# Patient Record
Sex: Female | Born: 1960 | Race: White | Hispanic: No | Marital: Single | Smoking: Never smoker
Health system: Southern US, Community
[De-identification: ages and names within clinical notes are randomized; demographics above are authoritative.]

## PROBLEM LIST (undated history)

## (undated) DIAGNOSIS — F329 Major depressive disorder, single episode, unspecified: Secondary | ICD-10-CM

## (undated) DIAGNOSIS — K5190 Ulcerative colitis, unspecified, without complications: Secondary | ICD-10-CM

## (undated) DIAGNOSIS — B977 Papillomavirus as the cause of diseases classified elsewhere: Secondary | ICD-10-CM

## (undated) DIAGNOSIS — M810 Age-related osteoporosis without current pathological fracture: Secondary | ICD-10-CM

## (undated) DIAGNOSIS — F419 Anxiety disorder, unspecified: Secondary | ICD-10-CM

## (undated) DIAGNOSIS — F32A Depression, unspecified: Secondary | ICD-10-CM

## (undated) DIAGNOSIS — K219 Gastro-esophageal reflux disease without esophagitis: Secondary | ICD-10-CM

## (undated) DIAGNOSIS — K7460 Unspecified cirrhosis of liver: Secondary | ICD-10-CM

## (undated) HISTORY — PX: CHOLECYSTECTOMY: SHX55

## (undated) HISTORY — PX: EYE SURGERY: SHX253

## (undated) HISTORY — PX: COLON SURGERY: SHX602

---

## 2005-07-09 ENCOUNTER — Emergency Department: Payer: Self-pay | Admitting: Emergency Medicine

## 2006-06-19 ENCOUNTER — Ambulatory Visit: Payer: Self-pay | Admitting: Unknown Physician Specialty

## 2007-10-04 IMAGING — US ABDOMEN ULTRASOUND
1 series · 17 of 25 positions shown · non-contrast
Comparison: none

REASON FOR EXAM: Abdominal pain  RUQ
COMMENTS:

[Series 1: abdomen ultrasound · 17 of 56 slices shown]
[im 1/56]
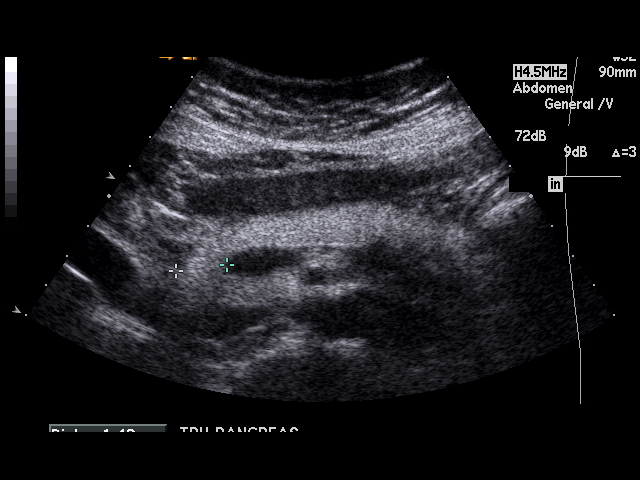
[im 5/56]
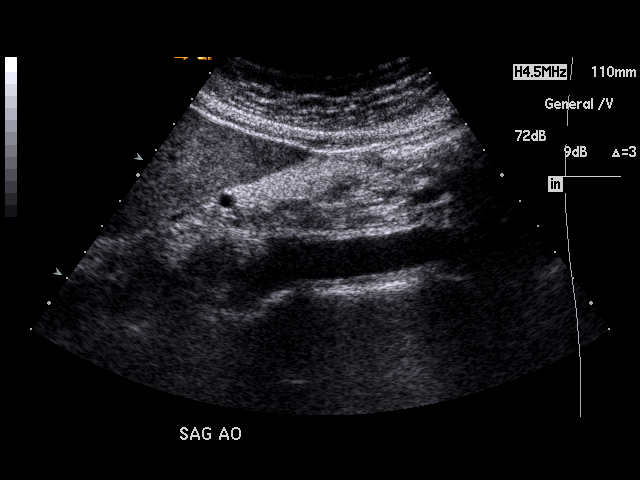
[im 7/56]
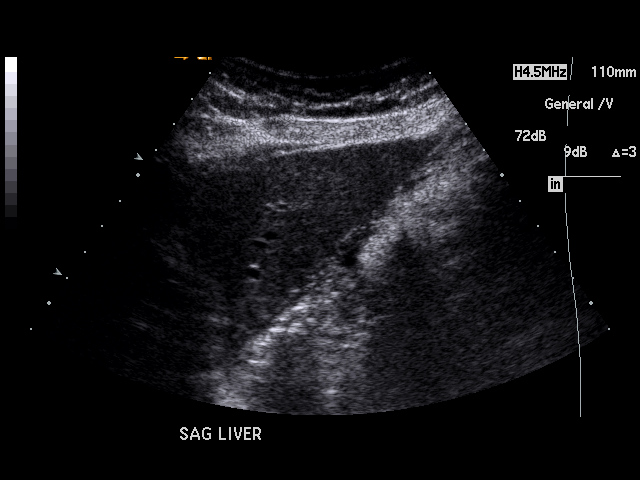
[im 12/56]
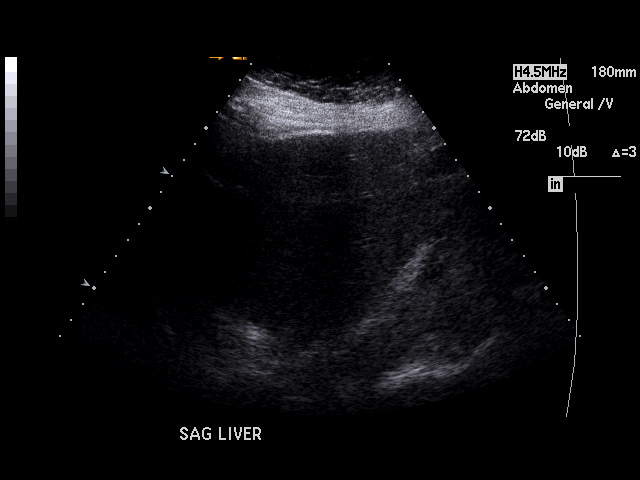
[im 14/56]
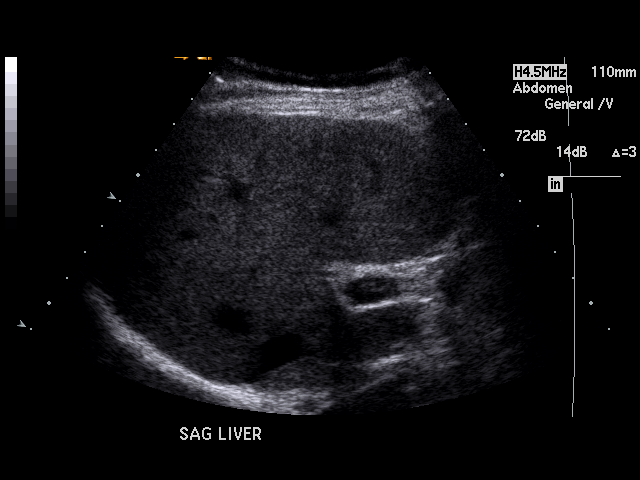
[im 19/56]
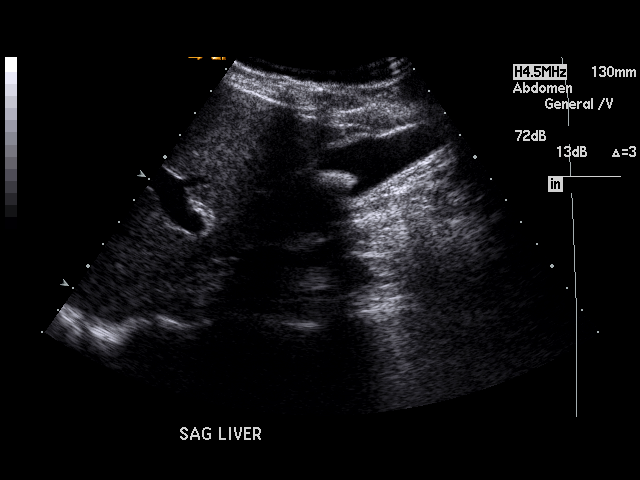
[im 21/56]
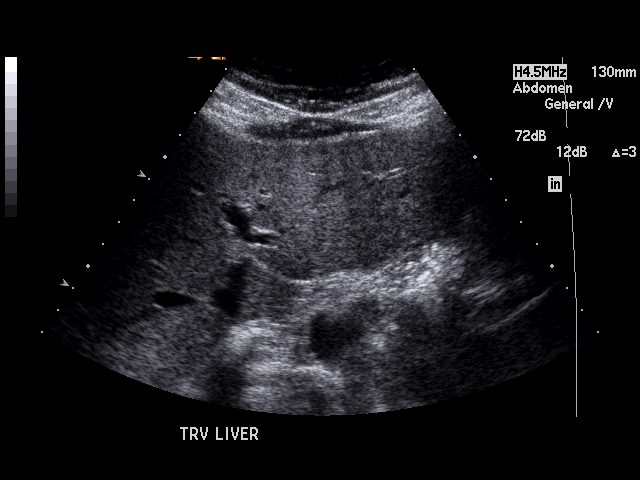
[im 26/56]
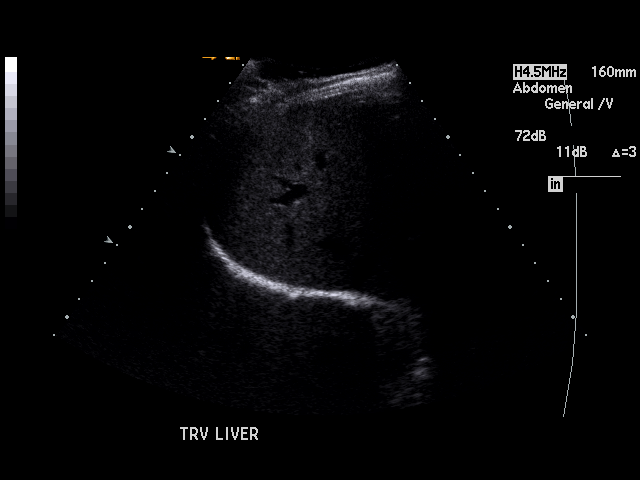
[im 28/56]
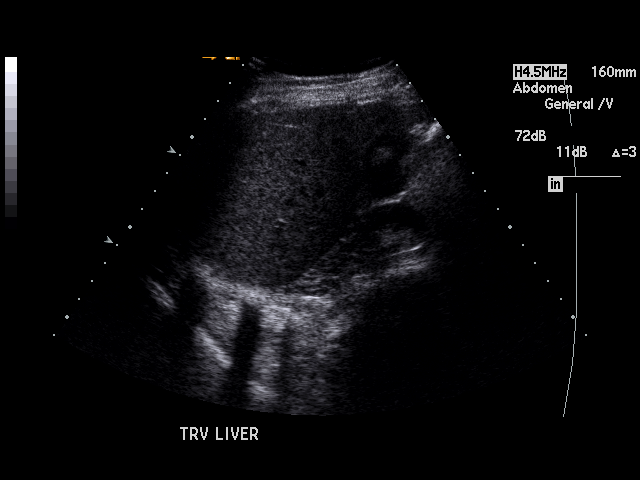
[im 30/56]
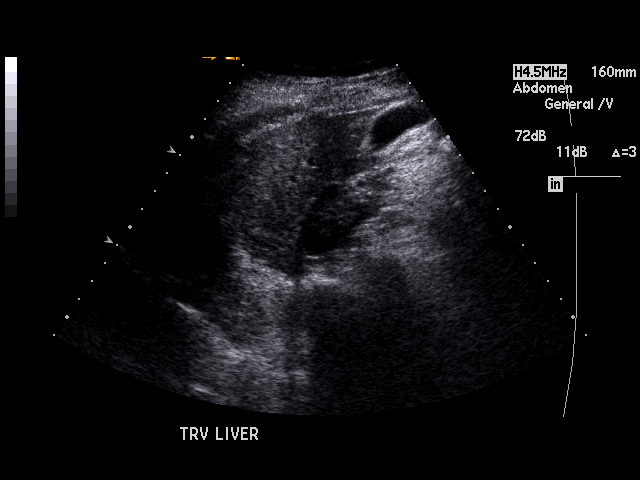
[im 35/56]
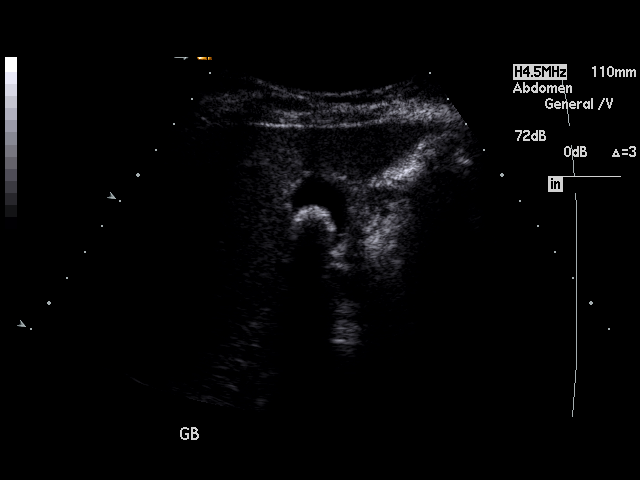
[im 37/56]
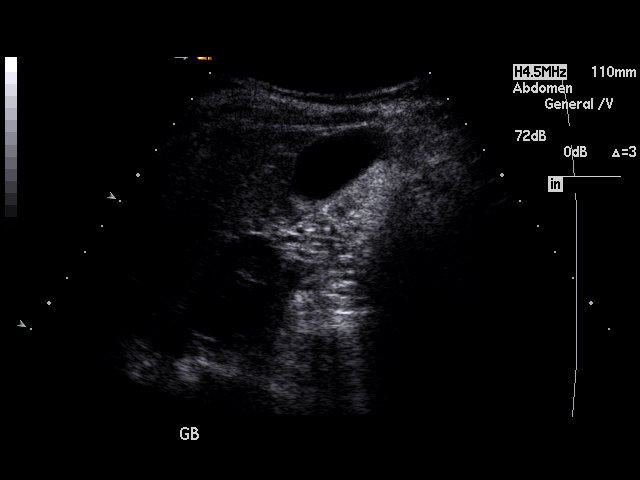
[im 42/56]
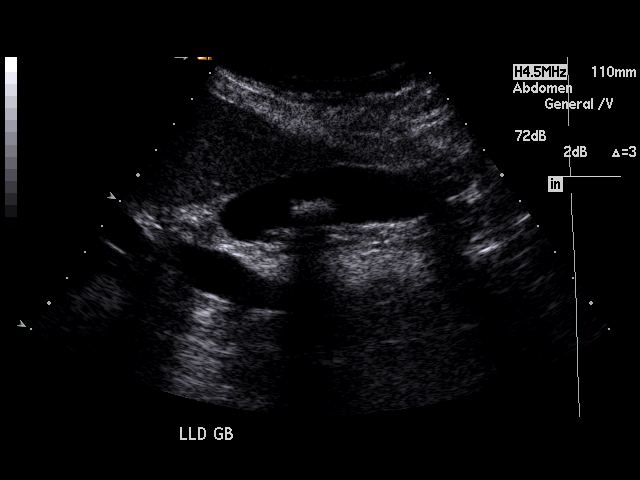
[im 44/56]
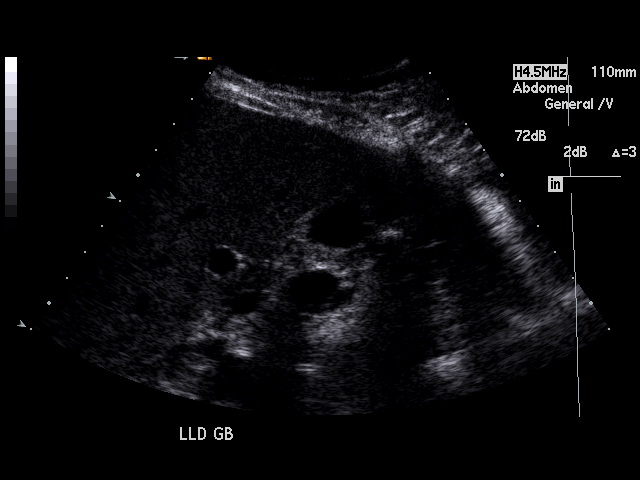
[im 49/56]
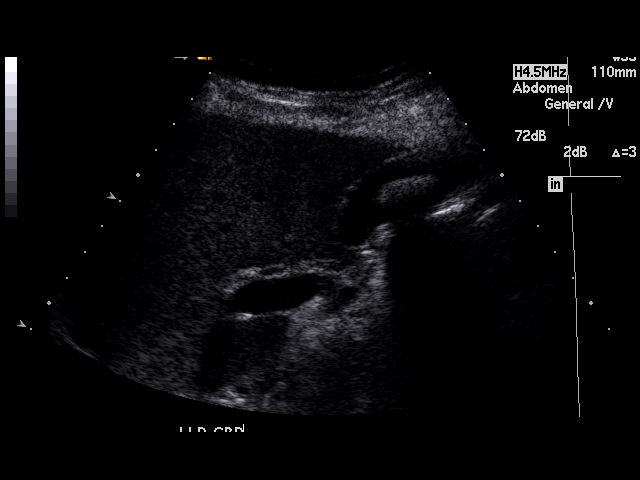
[im 51/56]
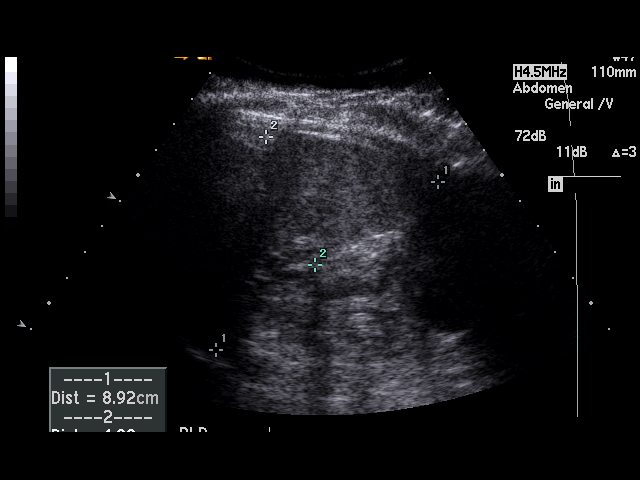
[im 56/56]
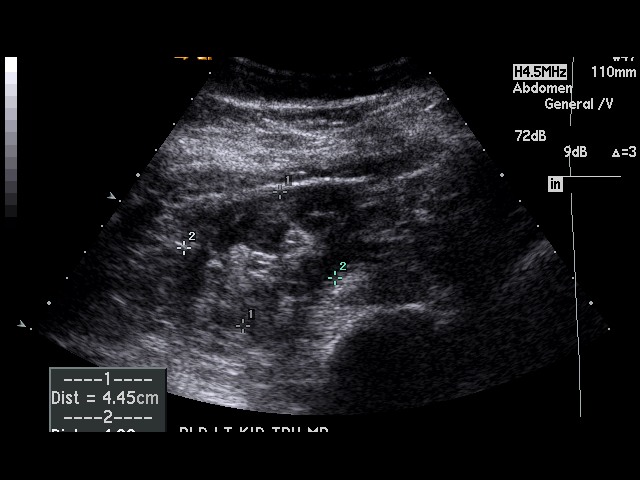

[17 of 25 positions shown; findings below may reference images not displayed]

PROCEDURE:     US  - US ABDOMEN GENERAL SURVEY  - June 19, 2006  [DATE]

RESULT:       The liver demonstrates a homogenous echotexture.  The common
bile duct measures 3 mm in diameter. There is no evidence of intra or
extrahepatic biliary ductal dilatation.

Evaluation of the gallbladder demonstrates no evidence of pericholecystic
fluid.  A mobile gallstone is demonstrated within the gallbladder.  This
appears to measure approximately 1.5 cm in diameter.  Gallbladder wall
thickness is 2.9 mm.  The patient did not demonstrate a sonographic Murphy
sign.   Hepatopetal flow is demonstrated within the portal vein.  The
pancreas, spleen and kidneys are unremarkable.  There is no evidence of
abdominal aortic aneurysm.
IMPRESSION: Mobile gallstone without further sonographic abnormalities.

## 2011-01-16 ENCOUNTER — Emergency Department: Payer: Self-pay | Admitting: Emergency Medicine

## 2012-08-06 ENCOUNTER — Emergency Department: Payer: Self-pay | Admitting: Emergency Medicine

## 2012-08-19 ENCOUNTER — Emergency Department: Payer: Self-pay | Admitting: Emergency Medicine

## 2012-12-13 ENCOUNTER — Emergency Department: Payer: Self-pay | Admitting: Emergency Medicine

## 2012-12-27 ENCOUNTER — Emergency Department: Payer: Self-pay | Admitting: Internal Medicine

## 2016-12-08 ENCOUNTER — Encounter: Payer: Self-pay | Admitting: *Deleted

## 2016-12-08 ENCOUNTER — Ambulatory Visit
Admission: EM | Admit: 2016-12-08 | Discharge: 2016-12-08 | Disposition: A | Discharge status: Home / Self Care | Payer: BC Managed Care – PPO

## 2016-12-08 DIAGNOSIS — S90211A Contusion of right great toe with damage to nail, initial encounter: Secondary | ICD-10-CM | POA: Diagnosis not present

## 2016-12-08 DIAGNOSIS — S99921S Unspecified injury of right foot, sequela: Secondary | ICD-10-CM

## 2016-12-08 HISTORY — DX: Major depressive disorder, single episode, unspecified: F32.9

## 2016-12-08 HISTORY — DX: Anxiety disorder, unspecified: F41.9

## 2016-12-08 HISTORY — DX: Gastro-esophageal reflux disease without esophagitis: K21.9

## 2016-12-08 HISTORY — DX: Ulcerative colitis, unspecified, without complications: K51.90

## 2016-12-08 HISTORY — DX: Unspecified cirrhosis of liver: K74.60

## 2016-12-08 HISTORY — DX: Papillomavirus as the cause of diseases classified elsewhere: B97.7

## 2016-12-08 HISTORY — DX: Depression, unspecified: F32.A

## 2016-12-08 HISTORY — DX: Age-related osteoporosis without current pathological fracture: M81.0

## 2016-12-08 NOTE — Discharge Instructions (Signed)
-  allow nail to fall off naturally -can secure nail to toe with tape or band aid if needed to allow for wearing shoes or socks without discomfort or further injury. -can continue to use OTC ointment if needed

## 2016-12-08 NOTE — ED Provider Notes (Signed)
CSN: [id number]     Arrival date & time 12/08/16  1120 History   First MD Initiated Contact with Patient 12/08/16 1144     Chief Complaint  Patient presents with  . Toe Injury   (Consider location/radiation/quality/duration/timing/severity/associated sxs/prior Treatment) Patient is a 56 year old female with past medical history as noted below who presents with complaint of right toenail injury approximately 2 weeks ago. Patient reports stopping her toenail on a railroad tie approximate 2 weeks ago. She states she has used some antibiotic ointment and this kept the toe taped. She denies any pain. Patient reports that most of her nail peeled up this morning but it is still attached at the base.      Past Medical History:  Diagnosis Date  . Anxiety   . Colitis, ulcerative (HCC)   . Depression (emotion)   . GERD (gastroesophageal reflux disease)   . HPV (human papilloma virus) infection   . Liver cirrhosis (HCC)   . Osteoporosis    Past Surgical History:  Procedure Laterality Date  . CHOLECYSTECTOMY    . COLON SURGERY    . EYE SURGERY     History reviewed. No pertinent family history. Social History  Substance Use Topics  . Smoking status: Never Smoker  . Smokeless tobacco: Never Used  . Alcohol use No   OB History    No data available     Review of Systems  As noted above in history of present illness. Patient does have a history of anxiety. Other systems reviewed and found to be negative.  Allergies  Oxycodone; Promethazine; and Sulfa antibiotics  Home Medications   Prior to Admission medications   Medication Sig Start Date End Date Taking? Authorizing Provider  acetaZOLAMIDE (DIAMOX) 250 MG tablet Take 250 mg by mouth 3 (three) times daily.   Yes [provider]  alendronate (FOSAMAX) 70 MG tablet Take 70 mg by mouth once a week. Take with a full glass of water on an empty stomach.   Yes [provider]  brimonidine (ALPHAGAN P) 0.1 % SOLN    Yes  [provider]  Fluocinonide Emulsified Base (LIDEX-E EX) Apply topically.   Yes [provider]  loteprednol (LOTEMAX) 0.2 % SUSP 1 drop 4 (four) times daily.   Yes [provider]  Multiple Vitamins-Minerals (MULTIVITAMIN WITH MINERALS) tablet Take 1 tablet by mouth daily.   Yes [provider]  omeprazole (PRILOSEC) 20 MG capsule Take 20 mg by mouth daily.   Yes [provider]  predniSONE (DELTASONE) 10 MG tablet Take 10 mg by mouth daily with breakfast.   Yes [provider]  triamcinolone cream (KENALOG) 0.1 % Apply 1 application topically 2 (two) times daily.   Yes [provider]   Meds Ordered and Administered this Visit  Medications - No data to display  BP 118/82 (BP Location: Left Arm)   Pulse 77   Temp 97.9 F (36.6 C)   Resp 16   Ht 5\' 2"  (1.575 m)   Wt 114 lb (51.7 kg)   SpO2 99%   BMI 20.85 kg/m  No data found.   Physical Exam  Constitutional: She is oriented to person, place, and time.  Eyes: Pupils are equal, round, and reactive to light. EOM are normal.  Neck: Normal range of motion.  Cardiovascular: Normal rate.   Pulmonary/Chest: Effort normal.  Musculoskeletal: Normal range of motion.       Feet:  Neurological: She is alert and oriented to person, place,  and time.  Skin: Skin is warm and dry.    Urgent Care Course     Procedures (including critical care time)  Labs Review Labs Reviewed - No data to display  Imaging Review No results found.   MDM   1. Injury of right great toe, sequela    Patient presents status post injury to her right great toe 2 weeks ago with complaint of her toenail not completely coming off. Patient states her nail started to separate today but still remains connected at the base. There are no signs of infection and nail is, as above, only attached at the base. Advised patient would be best to leave the nail and allow it to fall off naturally so as not to  provide a access point for infection. Patient advised that she take use Band-Aid or tape to hold the nail down so that it would not interfere with wearing socks or her shoes until the nail fell off. patient verbalized understanding is in agreement with the plan.  Candis SchatzMichael D Delonta Yohannes, PA-C    Candis SchatzHarris, Audi Wettstein D, PA-C 12/08/16 [phone]

## 2016-12-08 NOTE — ED Triage Notes (Signed)
Patient injured right big toe nail 2 weeks ago. The toenail is only attached at the base.

## 2017-08-02 ENCOUNTER — Ambulatory Visit
Admission: EM | Admit: 2017-08-02 | Discharge: 2017-08-02 | Disposition: A | Discharge status: Home / Self Care | Payer: BC Managed Care – PPO | Attending: Family Medicine | Admitting: Family Medicine

## 2017-08-02 DIAGNOSIS — M940 Chondrocostal junction syndrome [Tietze]: Secondary | ICD-10-CM

## 2017-08-02 DIAGNOSIS — J4521 Mild intermittent asthma with (acute) exacerbation: Secondary | ICD-10-CM

## 2017-08-02 DIAGNOSIS — J209 Acute bronchitis, unspecified: Secondary | ICD-10-CM | POA: Diagnosis not present

## 2017-08-02 MED ORDER — ALBUTEROL SULFATE HFA 108 (90 BASE) MCG/ACT IN AERS: 2.0000 | INHALATION_SPRAY | Freq: Four times a day (QID) | RESPIRATORY_TRACT | 0 refills | Status: DC | PRN

## 2017-08-02 MED ORDER — AZITHROMYCIN 250 MG PO TABS: 250.0000 mg | ORAL_TABLET | Freq: Every day | ORAL | 0 refills | Status: DC

## 2017-08-02 NOTE — ED Triage Notes (Signed)
Pt said she has been having chest congestion for 2 weeks. Said it's worse when she lays down and is unable to sleep along wheezing. Said it's a dry cough and runny nose. Has been taking mucinex without relief.

## 2017-08-02 NOTE — ED Provider Notes (Signed)
[facility]    CSN: [id number] Arrival date & time: 08/02/17  1934     History   Chief Complaint Chief Complaint  Patient presents with  . Nasal Congestion    HPI Joni Fearsamela Y Vohs is a 57 y.o. female.   The history is provided by the patient.  Cough  Cough characteristics:  Productive Sputum characteristics:  White Severity:  Mild Duration:  2 weeks Timing:  Intermittent Progression:  Waxing and waning Smoker: no   Context: exposure to allergens and weather changes   Context: not animal exposure, not fumes, not occupational exposure, not sick contacts, not smoke exposure, not upper respiratory infection and not with activity   Relieved by:  Nothing Worsened by:  Nothing Ineffective treatments: prednisone. Associated symptoms: shortness of breath and wheezing   Associated symptoms: no chills, no ear fullness, no ear pain, no eye discharge, no fever, no myalgias, no rash, no rhinorrhea, no sinus congestion and no sore throat     Past Medical History:  Diagnosis Date  . Anxiety   . Colitis, ulcerative (HCC)   . Depression (emotion)   . GERD (gastroesophageal reflux disease)   . HPV (human papilloma virus) infection   . Liver cirrhosis (HCC)   . Osteoporosis     There are no active problems to display for this patient.   Past Surgical History:  Procedure Laterality Date  . CHOLECYSTECTOMY    . COLON SURGERY    . EYE SURGERY      OB History   None      Home Medications    Prior to Admission medications   Medication Sig Start Date End Date Taking? Authorizing Provider  acetaZOLAMIDE (DIAMOX) 250 MG tablet Take 250 mg by mouth 3 (three) times daily.   Yes [provider]  alendronate (FOSAMAX) 70 MG tablet Take 70 mg by mouth once a week. Take with a full glass of water on an empty stomach.   Yes [provider]  brimonidine (ALPHAGAN P) 0.1 % SOLN    Yes [provider]  Fluocinonide Emulsified Base (LIDEX-E EX)  Apply topically.   Yes [provider]  loteprednol (LOTEMAX) 0.2 % SUSP 1 drop 4 (four) times daily.   Yes [provider]  Multiple Vitamins-Minerals (MULTIVITAMIN WITH MINERALS) tablet Take 1 tablet by mouth daily.   Yes [provider]  omeprazole (PRILOSEC) 20 MG capsule Take 20 mg by mouth daily.   Yes [provider]  predniSONE (DELTASONE) 10 MG tablet Take 10 mg by mouth daily with breakfast.   Yes [provider]  triamcinolone cream (KENALOG) 0.1 % Apply 1 application topically 2 (two) times daily.   Yes [provider]  albuterol (PROVENTIL HFA;VENTOLIN HFA) 108 (90 Base) MCG/ACT inhaler Inhale 2 puffs into the lungs every 6 (six) hours as needed for wheezing or shortness of breath. 08/02/17   Duanne LimerickJones, Deanna C, MD  azithromycin (ZITHROMAX) 250 MG tablet Take 1 tablet (250 mg total) by mouth daily. Take first 2 tablets together, then 1 every day until finished. 08/02/17   Duanne LimerickJones, Deanna C, MD    Family History No family history on file.  Social History Social History   Tobacco Use  . Smoking status: Never Smoker  . Smokeless tobacco: Never Used  Substance Use Topics  . Alcohol use: No  . Drug use: Not on file     Allergies   Oxycodone; Promethazine; and Sulfa antibiotics   Review of Systems Review of Systems  Constitutional: Negative.  Negative for chills, fatigue, fever and unexpected weight change.  HENT: Negative for congestion, ear discharge, ear pain, rhinorrhea, sinus pressure, sneezing and sore throat.   Eyes: Negative for photophobia, pain, discharge, redness and itching.  Respiratory: Positive for cough, shortness of breath and wheezing. Negative for stridor.   Gastrointestinal: Negative for abdominal pain, nausea and vomiting.  Endocrine: Negative for cold intolerance, heat intolerance, polydipsia, polyphagia and polyuria.  Genitourinary: Negative for flank pain.  Musculoskeletal: Negative for arthralgias,  back pain and myalgias.  Skin: Negative for rash.  Allergic/Immunologic: Negative for environmental allergies and food allergies.  Neurological: Negative for dizziness and weakness.     Physical Exam Triage Vital Signs ED Triage Vitals  Enc Vitals Group     BP 08/02/17 1953 135/85     Pulse Rate 08/02/17 1953 82     Resp 08/02/17 1953 18     Temp 08/02/17 1953 98.4 F (36.9 C)     Temp Source 08/02/17 1953 Oral     SpO2 08/02/17 1953 96 %     Weight --      Height --      Head Circumference --      Peak Flow --      Pain Score 08/02/17 1954 0     Pain Loc --      Pain Edu? --      Excl. in GC? --    No data found.  Updated Vital Signs BP 135/85 (BP Location: Left Arm)   Pulse 82   Temp 98.4 F (36.9 C) (Oral)   Resp 18   SpO2 96%   Visual Acuity Right Eye Distance:   Left Eye Distance:   Bilateral Distance:    Right Eye Near:   Left Eye Near:    Bilateral Near:     Physical Exam  Constitutional: She is oriented to person, place, and time. She appears well-developed and well-nourished.  HENT:  Head: Normocephalic.  Right Ear: External ear normal.  Left Ear: External ear normal.  Mouth/Throat: Oropharynx is clear and moist.  Eyes: Pupils are equal, round, and reactive to light. Conjunctivae and EOM are normal. Lids are everted and swept, no foreign bodies found. Left eye exhibits no hordeolum. No foreign body present in the left eye. Right conjunctiva is not injected. Left conjunctiva is not injected. No scleral icterus.  Neck: Normal range of motion. Neck supple. No JVD present. No tracheal deviation present. No thyromegaly present.  Cardiovascular: Normal rate, regular rhythm, normal heart sounds and intact distal pulses. Exam reveals no gallop and no friction rub.  No murmur heard. Pulmonary/Chest: Effort normal. No stridor. No respiratory distress. She has wheezes. She has no rales. She exhibits tenderness.  Abdominal: Soft. Bowel sounds are normal. She  exhibits no mass. There is no hepatosplenomegaly. There is no tenderness. There is no rebound and no guarding.  Musculoskeletal: Normal range of motion. She exhibits no edema or tenderness.  Lymphadenopathy:    She has no cervical adenopathy.  Neurological: She is alert and oriented to person, place, and time. She has normal strength. She displays normal reflexes. No cranial nerve deficit.  Skin: Skin is warm. No rash noted.  Psychiatric: She has a normal mood and affect. Her mood appears not anxious. She does not exhibit a depressed mood.  Nursing note and vitals reviewed.    UC Treatments / Results  Labs (all labs ordered are listed, but only abnormal results are displayed) Labs Reviewed - No  data to display  EKG None Radiology No results found.  Procedures Procedures (including critical care time)  Medications Ordered in UC Medications - No data to display   Initial Impression / Assessment and Plan / UC Course  I have reviewed the triage vital signs and the nursing notes.  Pertinent labs & imaging results that were available during my care of the patient were reviewed by me and considered in my medical decision making (see chart for details).       Final Clinical Impressions(s) / UC Diagnoses   Final diagnoses:  Acute bronchitis, unspecified organism  Mild intermittent reactive airway disease with acute exacerbation  Costochondritis    ED Discharge Orders        Ordered    azithromycin (ZITHROMAX) 250 MG tablet  Daily     08/02/17 2056    albuterol (PROVENTIL HFA;VENTOLIN HFA) 108 (90 Base) MCG/ACT inhaler  Every 6 hours PRN     08/02/17 2056       Controlled Substance Prescriptions [place] Controlled Substance Registry consulted?    Duanne Limerick, MD 08/02/17 [phone]

## 2017-12-26 ENCOUNTER — Encounter: Payer: Self-pay | Admitting: Emergency Medicine

## 2017-12-26 ENCOUNTER — Other Ambulatory Visit: Payer: Self-pay

## 2017-12-26 ENCOUNTER — Ambulatory Visit
Admission: EM | Admit: 2017-12-26 | Discharge: 2017-12-26 | Disposition: A | Discharge status: Home / Self Care | Payer: BC Managed Care – PPO | Attending: Family Medicine | Admitting: Family Medicine

## 2017-12-26 DIAGNOSIS — H6123 Impacted cerumen, bilateral: Secondary | ICD-10-CM | POA: Diagnosis not present

## 2017-12-26 NOTE — ED Provider Notes (Signed)
[facility]    CSN: [id number] Arrival date & time: 12/26/17  1939  History   Chief Complaint Chief Complaint  Patient presents with  . Otalgia    left   HPI   57 year old female presents with left ear pain.  Patient reports that she developed left ear pain yesterday.  Moderate to severe.  No known inciting factor.  No reports of drainage from the ears.  No recent URI.  No recent swimming.  No known exacerbating or relieving factors.  No other reported symptoms.  No other complaints.  Past Medical History:  Diagnosis Date  . Anxiety   . Colitis, ulcerative (HCC)   . Depression (emotion)   . GERD (gastroesophageal reflux disease)   . HPV (human papilloma virus) infection   . Liver cirrhosis (HCC)   . Osteoporosis    Past Surgical History:  Procedure Laterality Date  . CHOLECYSTECTOMY    . COLON SURGERY    . EYE SURGERY     OB History   None    Home Medications    Prior to Admission medications   Medication Sig Start Date End Date Taking? Authorizing Provider  FLUoxetine (PROZAC) 20 MG capsule Take by mouth. 10/31/17  Yes [provider]  predniSONE (DELTASONE) 10 MG tablet Take 10 mg by mouth daily with breakfast.   Yes [provider]  traZODone (DESYREL) 50 MG tablet  12/12/17  Yes [provider]  acetaZOLAMIDE (DIAMOX) 250 MG tablet Take 250 mg by mouth 3 (three) times daily.    [provider]  albuterol (PROVENTIL HFA;VENTOLIN HFA) 108 (90 Base) MCG/ACT inhaler Inhale 2 puffs into the lungs every 6 (six) hours as needed for wheezing or shortness of breath. 08/02/17   Duanne LimerickJones, Deanna C, MD  brimonidine (ALPHAGAN P) 0.1 % SOLN     [provider]  Fluocinonide Emulsified Base (LIDEX-E EX) Apply topically.    [provider]  loteprednol (LOTEMAX) 0.2 % SUSP 1 drop 4 (four) times daily.    [provider]  Multiple Vitamins-Minerals (MULTIVITAMIN WITH MINERALS) tablet Take 1 tablet by mouth  daily.    [provider]  omeprazole (PRILOSEC) 20 MG capsule Take 20 mg by mouth daily.    [provider]  triamcinolone cream (KENALOG) 0.1 % Apply 1 application topically 2 (two) times daily.    [provider]   Family History Family History  Problem Relation Age of Onset  . Dementia Mother   . Heart disease Father   . Glaucoma Father    Social History Social History   Tobacco Use  . Smoking status: Never Smoker  . Smokeless tobacco: Never Used  Substance Use Topics  . Alcohol use: No  . Drug use: Never   Allergies   Oxycodone; Promethazine; and Sulfa antibiotics   Review of Systems Review of Systems  Constitutional: Negative.   HENT: Positive for ear pain.    Physical Exam Triage Vital Signs ED Triage Vitals  Enc Vitals Group     BP 12/26/17 1949 (!) 131/93     Pulse Rate 12/26/17 1949 85     Resp 12/26/17 1949 17     Temp 12/26/17 1949 98.2 F (36.8 C)     Temp Source 12/26/17 1949 Oral     SpO2 12/26/17 1949 100 %     Weight 12/26/17 1948 114 lb (51.7 kg)     Height 12/26/17 1948 5\' 2"  (1.575 m)     Head Circumference --  Peak Flow --      Pain Score 12/26/17 1947 4     Pain Loc --      Pain Edu? --      Excl. in GC? --    Updated Vital Signs BP (!) 131/93 (BP Location: Left Arm)   Pulse 85   Temp 98.2 F (36.8 C) (Oral)   Resp 17   Ht 5\' 2"  (1.575 m)   Wt 51.7 kg   SpO2 100%   BMI 20.85 kg/m   Visual Acuity Right Eye Distance:   Left Eye Distance:   Bilateral Distance:    Right Eye Near:   Left Eye Near:    Bilateral Near:     Physical Exam  Constitutional: She is oriented to person, place, and time. She appears well-developed. No distress.  HENT:  Head: Normocephalic and atraumatic.  Mouth/Throat: Oropharynx is clear and moist.  Cerumen impaction bilaterally.  Cardiovascular: Normal rate and regular rhythm.  Pulmonary/Chest: Effort normal. No respiratory distress.  Neurological: She is alert and  oriented to person, place, and time.  Psychiatric:  Flat affect. Depressed mood.  Nursing note and vitals reviewed.  UC Treatments / Results  Labs (all labs ordered are listed, but only abnormal results are displayed) Labs Reviewed - No data to display  EKG None  Radiology No results found.  Procedures Procedures (including critical care time)  Medications Ordered in UC Medications - No data to display  Initial Impression / Assessment and Plan / UC Course  I have reviewed the triage vital signs and the nursing notes.  Pertinent labs & imaging results that were available during my care of the patient were reviewed by me and considered in my medical decision making (see chart for details).    57 year old female presents with otalgia.  Bilateral cerumen impaction.  Successful irrigation today.  Supportive care.  Final Clinical Impressions(s) / UC Diagnoses   Final diagnoses:  Bilateral impacted cerumen   Discharge Instructions   None    ED Prescriptions    None     Controlled Substance Prescriptions [place] Controlled Substance Registry consulted? Not Applicable   Tommie SamsCook, Kylar Speelman G, DO 12/26/17 2034

## 2017-12-26 NOTE — ED Triage Notes (Signed)
Pt c/o left ear pain. Started 1 day ago. Denies fevers, or chills.

## 2018-05-15 ENCOUNTER — Other Ambulatory Visit: Payer: Self-pay

## 2018-05-15 ENCOUNTER — Ambulatory Visit (INDEPENDENT_AMBULATORY_CARE_PROVIDER_SITE_OTHER): Payer: BC Managed Care – PPO

## 2018-05-15 ENCOUNTER — Ambulatory Visit
Admission: EM | Admit: 2018-05-15 | Discharge: 2018-05-15 | Disposition: A | Discharge status: Home / Self Care | Payer: BC Managed Care – PPO | Attending: Family Medicine | Admitting: Family Medicine

## 2018-05-15 DIAGNOSIS — L03011 Cellulitis of right finger: Secondary | ICD-10-CM | POA: Diagnosis not present

## 2018-05-15 MED ORDER — DOXYCYCLINE HYCLATE 100 MG PO CAPS: 100.0000 mg | ORAL_CAPSULE | Freq: Two times a day (BID) | ORAL | 0 refills | Status: DC

## 2018-05-15 MED ORDER — MUPIROCIN 2 % EX OINT: 1.0000 "application " | TOPICAL_OINTMENT | Freq: Three times a day (TID) | CUTANEOUS | 0 refills | Status: DC

## 2018-05-15 MED ORDER — NAPROXEN 375 MG PO TABS: 375.0000 mg | ORAL_TABLET | Freq: Two times a day (BID) | ORAL | 0 refills | Status: DC

## 2018-05-15 NOTE — ED Provider Notes (Signed)
[facility]    CSN: [id number] Arrival date & time: 05/15/18  1723     History   Chief Complaint Chief Complaint  Patient presents with  . Insect Bite    HPI Ann Roman is a 58 y.o. female.   HPI  -year-old female presents with a red swollen tender middle and ring at MCP joints.  She states that she was visiting in [place] woke up on Monday days prior to this visit with the redness swelling and pain.  It is painful to extend her fingers and also to make a fist.  There is no noticeable bite marks nor any disruption of the skin.  Is a definite swelling and bogginess of the fourth MCP joint.  Or any injury.  States that the swelling has been worsening.  He is right-hand dominant.        Past Medical History:  Diagnosis Date  . Anxiety   . Colitis, ulcerative (HCC)   . Depression (emotion)   . GERD (gastroesophageal reflux disease)   . HPV (human papilloma virus) infection   . Liver cirrhosis (HCC)   . Osteoporosis     There are no active problems to display for this patient.   Past Surgical History:  Procedure Laterality Date  . CHOLECYSTECTOMY    . COLON SURGERY    . EYE SURGERY      OB History   No obstetric history on file.      Home Medications    Prior to Admission medications   Medication Sig Start Date End Date Taking? Authorizing Provider  FLUoxetine (PROZAC) 20 MG capsule Take by mouth. 10/31/17  Yes [provider]  omeprazole (PRILOSEC) 20 MG capsule Take 20 mg by mouth daily.   Yes [provider]  prednisoLONE acetate (PRED MILD) 0.12 % ophthalmic suspension 1 drop 4 (four) times daily.   Yes [provider]  predniSONE (DELTASONE) 10 MG tablet Take 10 mg by mouth daily with breakfast.   Yes [provider]  timolol (BETIMOL) 0.25 % ophthalmic solution 1-2 drops 2 (two) times daily.   Yes [provider]  doxycycline (VIBRAMYCIN) 100 MG capsule Take 1 capsule (100 mg total) by  mouth 2 (two) times daily. 05/15/18   Lutricia Feil, PA-C  mupirocin ointment (BACTROBAN) 2 % Apply 1 application topically 3 (three) times daily. 05/15/18   Lutricia Feil, PA-C  naproxen (NAPROSYN) 375 MG tablet Take 1 tablet (375 mg total) by mouth 2 (two) times daily. 05/15/18   Lutricia Feil, PA-C    Family History Family History  Problem Relation Age of Onset  . Dementia Mother   . Heart disease Father   . Glaucoma Father     Social History Social History   Tobacco Use  . Smoking status: Never Smoker  . Smokeless tobacco: Never Used  Substance Use Topics  . Alcohol use: No  . Drug use: Never     Allergies   Oxycodone; Promethazine; and Sulfa antibiotics   Review of Systems Review of Systems  Constitutional: Positive for activity change. Negative for chills, fatigue and fever.  Musculoskeletal: Positive for arthralgias and joint swelling.  All other systems reviewed and are negative.    Physical Exam Triage Vital Signs ED Triage Vitals  Enc Vitals Group     BP 05/15/18 1845 (!) 136/91     Pulse Rate 05/15/18 1845 82     Resp --      Temp 05/15/18 1845  98 F (36.7 C)     Temp Source 05/15/18 1845 Oral     SpO2 05/15/18 1845 100 %     Weight 05/15/18 1846 116 lb (52.6 kg)     Height 05/15/18 1846 5\' 2"  (1.575 m)     Head Circumference --      Peak Flow --      Pain Score 05/15/18 1846 7     Pain Loc --      Pain Edu? --      Excl. in GC? --    No data found.  Updated Vital Signs BP (!) 136/91   Pulse 82   Temp 98 F (36.7 C) (Oral)   Ht 5\' 2"  (1.575 m)   Wt 116 lb (52.6 kg)   SpO2 100%   BMI 21.22 kg/m   Visual Acuity Right Eye Distance:   Left Eye Distance:   Bilateral Distance:    Right Eye Near:   Left Eye Near:    Bilateral Near:     Physical Exam Vitals signs and nursing note reviewed.  Constitutional:      General: She is not in acute distress.    Appearance: Normal appearance. She is normal weight. She is not  ill-appearing, toxic-appearing or diaphoretic.  HENT:     Head: Normocephalic.  Eyes:     General:        Right eye: No discharge.        Left eye: No discharge.     Conjunctiva/sclera: Conjunctivae normal.  Neck:     Musculoskeletal: Normal range of motion and neck supple.  Musculoskeletal:        General: Swelling and tenderness present. No signs of injury.     Comments: Minasian of the dorsum of the hand shows swelling erythema warmth and tenderness over the third and fourth metacarpophalangeal joints.  Fourth MCP is more swollen than the third.  Patient is able to extend her fingers fully against resistance but does have discomfort at full extension.  She is able to flex her fingers into her palm without difficulty except for decreased range of motion due to the swelling.  There is no breaks in the skin.  There are no puncture wounds seen.  Fingers are normal as is the wrist.  Skin:    General: Skin is warm.     Findings: Erythema present.  Neurological:     General: No focal deficit present.     Mental Status: She is alert and oriented to person, place, and time.  Psychiatric:        Mood and Affect: Mood normal.        Behavior: Behavior normal.        Thought Content: Thought content normal.        Judgment: Judgment normal.      UC Treatments / Results  Labs (all labs ordered are listed, but only abnormal results are displayed) Labs Reviewed - No data to display  EKG None  Radiology Dg Hand Complete Right  Result Date: 05/15/2018 CLINICAL DATA:  Unknown bite. Pain and swelling. EXAM: RIGHT HAND - COMPLETE 3+ VIEW COMPARISON:  None. FINDINGS: There is no evidence of fracture or dislocation. There is no evidence of arthropathy or other focal bone abnormality. Dorsal soft tissue swelling. IMPRESSION: Negative for fracture or osseous lesion. Electronically Signed   By: Elsie StainJohn T Curnes M.D.   On: 05/15/2018 19:49    Procedures Procedures (including critical care  time)  Medications Ordered in UC  Medications - No data to display  Initial Impression / Assessment and Plan / UC Course  I have reviewed the triage vital signs and the nursing notes.  Pertinent labs & imaging results that were available during my care of the patient were reviewed by me and considered in my medical decision making (see chart for details).   Likely has a cellulitis of the hand.  The cause is uncertain.  The blanchable tender warm areas are suggestive of cellulitis.  Place her on doxycycline and also Bactroban ointment that she will apply 3 times a day.  I will also provide her with Naprosyn 375 twice a day with food for pain.  Follow-up with her primary care physician next week or may return to our clinic if she is not improving in 2 days.   Final Clinical Impressions(s) / UC Diagnoses   Final diagnoses:  Cellulitis of finger of right hand     Discharge Instructions     Elevate your hand above the level of your heart sufficiently to control swelling and pain.  Follow-up with your primary care physician next week    ED Prescriptions    Medication Sig Dispense Auth. Provider   doxycycline (VIBRAMYCIN) 100 MG capsule Take 1 capsule (100 mg total) by mouth 2 (two) times daily. 14 capsule Ovid Curd P, PA-C   mupirocin ointment (BACTROBAN) 2 % Apply 1 application topically 3 (three) times daily. 22 g Ovid Curd P, PA-C   naproxen (NAPROSYN) 375 MG tablet Take 1 tablet (375 mg total) by mouth 2 (two) times daily. 20 tablet Lutricia Feil, PA-C     Controlled Substance Prescriptions [place] Controlled Substance Registry consulted? Not Applicable   Lutricia Feil, PA-C 05/15/18 2026

## 2018-05-15 NOTE — ED Triage Notes (Signed)
Pt states she was in [place]. Woke up Monday and noticed the bite. States it is getting worse

## 2018-05-15 NOTE — Discharge Instructions (Signed)
Elevate your hand above the level of your heart sufficiently to control swelling and pain.  Follow-up with your primary care physician next week

## 2018-06-23 ENCOUNTER — Encounter: Payer: Self-pay | Admitting: Emergency Medicine

## 2018-06-23 ENCOUNTER — Ambulatory Visit
Admission: EM | Admit: 2018-06-23 | Discharge: 2018-06-23 | Disposition: A | Discharge status: Home / Self Care | Payer: BC Managed Care – PPO | Attending: Family Medicine | Admitting: Family Medicine

## 2018-06-23 ENCOUNTER — Other Ambulatory Visit: Payer: Self-pay

## 2018-06-23 DIAGNOSIS — J101 Influenza due to other identified influenza virus with other respiratory manifestations: Secondary | ICD-10-CM

## 2018-06-23 LAB — RAPID INFLUENZA A&B ANTIGENS (ARMC ONLY)
INFLUENZA A (ARMC): POSITIVE — AB
INFLUENZA B (ARMC): NEGATIVE

## 2018-06-23 MED ORDER — BENZONATATE 200 MG PO CAPS: ORAL_CAPSULE | ORAL | 0 refills | Status: DC

## 2018-06-23 MED ORDER — OSELTAMIVIR PHOSPHATE 75 MG PO CAPS: 75.0000 mg | ORAL_CAPSULE | Freq: Two times a day (BID) | ORAL | 0 refills | Status: DC

## 2018-06-23 NOTE — Discharge Instructions (Addendum)
Drink plenty of fluids.  Rest as much as possible.  Use Tylenol or Motrin for fever chills or body aches.  °

## 2018-06-23 NOTE — ED Provider Notes (Signed)
[facility]    CSN: [id number] Arrival date & time: 06/23/18  1553     History   Chief Complaint Chief Complaint  Patient presents with  . Cough  . Fever    HPI Ann Roman is a 58 y.o. female.   HPI  58 year old female presents with cough congestion and fever that started on Friday 3 days prior to this visit.  [facility].  She states that the cough is nonproductive.  She does have a lot of congestion.  Today temperature is normal at 98.3 pulse rate is 111 oxygenation 97% on room air and 18 respirations per minute          Past Medical History:  Diagnosis Date  . Anxiety   . Colitis, ulcerative (HCC)   . Depression (emotion)   . GERD (gastroesophageal reflux disease)   . HPV (human papilloma virus) infection   . Liver cirrhosis (HCC)   . Osteoporosis     There are no active problems to display for this patient.   Past Surgical History:  Procedure Laterality Date  . CHOLECYSTECTOMY    . COLON SURGERY    . EYE SURGERY      OB History   No obstetric history on file.      Home Medications    Prior to Admission medications   Medication Sig Start Date End Date Taking? Authorizing Provider  FLUoxetine (PROZAC) 20 MG capsule Take by mouth. 10/31/17  Yes [provider]  mupirocin ointment (BACTROBAN) 2 % Apply 1 application topically 3 (three) times daily. 05/15/18  Yes Lutricia Feiloemer, Lesleyann Fichter P, PA-C  omeprazole (PRILOSEC) 20 MG capsule Take 20 mg by mouth daily.   Yes [provider]  predniSONE (DELTASONE) 10 MG tablet Take 10 mg by mouth daily with breakfast.   Yes [provider]  benzonatate (TESSALON) 200 MG capsule Take one cap TID PRN cough 06/23/18   Lutricia Feiloemer, Cornesha Radziewicz P, PA-C  oseltamivir (TAMIFLU) 75 MG capsule Take 1 capsule (75 mg total) by mouth every 12 (twelve) hours. 06/23/18   Lutricia Feiloemer, Amaree Leeper P, PA-C  prednisoLONE acetate (PRED MILD) 0.12 % ophthalmic suspension 1 drop 4 (four)  times daily.    [provider]    Family History Family History  Problem Relation Age of Onset  . Dementia Mother   . Heart disease Father   . Glaucoma Father     Social History Social History   Tobacco Use  . Smoking status: Never Smoker  . Smokeless tobacco: Never Used  Substance Use Topics  . Alcohol use: No  . Drug use: Never     Allergies   Oxycodone; Promethazine; and Sulfa antibiotics   Review of Systems Review of Systems  Constitutional: Positive for activity change, appetite change, chills, fatigue and fever.  HENT: Positive for congestion.   Respiratory: Positive for cough.   All other systems reviewed and are negative.    Physical Exam Triage Vital Signs ED Triage Vitals  Enc Vitals Group     BP 06/23/18 1559 (!) 150/91     Pulse Rate 06/23/18 1559 (!) 111     Resp 06/23/18 1559 18     Temp 06/23/18 1559 98.3 F (36.8 C)     Temp Source 06/23/18 1559 Oral     SpO2 06/23/18 1559 97 %     Weight 06/23/18 1600 118 lb (53.5 kg)     Height 06/23/18 1600 5\' 1"  (1.549 m)  Head Circumference --      Peak Flow --      Pain Score 06/23/18 1600 8     Pain Loc --      Pain Edu? --      Excl. in GC? --    No data found.  Updated Vital Signs BP (!) 150/91 (BP Location: Left Arm)   Pulse (!) 111   Temp 98.3 F (36.8 C) (Oral)   Resp 18   Ht 5\' 1"  (1.549 m)   Wt 118 lb (53.5 kg)   SpO2 97%   BMI 22.30 kg/m   Visual Acuity Right Eye Distance:   Left Eye Distance:   Bilateral Distance:    Right Eye Near:   Left Eye Near:    Bilateral Near:     Physical Exam Vitals signs and nursing note reviewed.  Constitutional:      General: She is not in acute distress.    Appearance: Normal appearance. She is normal weight. She is ill-appearing. She is not toxic-appearing or diaphoretic.  HENT:     Head: Normocephalic.     Right Ear: Tympanic membrane, ear canal and external ear normal.     Left Ear: Tympanic membrane, ear canal and  external ear normal.     Nose: Nose normal.     Mouth/Throat:     Mouth: Mucous membranes are moist.     Pharynx: Oropharynx is clear.  Eyes:     General:        Right eye: No discharge.        Left eye: No discharge.     Conjunctiva/sclera: Conjunctivae normal.  Neck:     Musculoskeletal: Normal range of motion and neck supple.  Pulmonary:     Effort: Pulmonary effort is normal.     Breath sounds: Normal breath sounds.  Musculoskeletal: Normal range of motion.  Lymphadenopathy:     Cervical: No cervical adenopathy.  Skin:    General: Skin is warm and dry.  Neurological:     General: No focal deficit present.     Mental Status: She is alert and oriented to person, place, and time.  Psychiatric:        Mood and Affect: Mood normal.        Thought Content: Thought content normal.        Judgment: Judgment normal.      UC Treatments / Results  Labs (all labs ordered are listed, but only abnormal results are displayed) Labs Reviewed  RAPID INFLUENZA A&B ANTIGENS ([facility] ONLY) - Abnormal; Notable for the following components:      Result Value   Influenza A ([facility]) POSITIVE (*)    All other components within normal limits    EKG None  Radiology No results found.  Procedures Procedures (including critical care time)  Medications Ordered in UC Medications - No data to display  Initial Impression / Assessment and Plan / UC Course  I have reviewed the triage vital signs and the nursing notes.  Pertinent labs & imaging results that were available during my care of the patient were reviewed by me and considered in my medical decision making (see chart for details).   Patient has influenza A.  Will treat with Tamiflu and cough suppressants.  She will increase fluid intake rest as much as possible ;take Tylenol or Motrin for body aches fever or chills.   Final Clinical Impressions(s) / UC Diagnoses   Final diagnoses:  Influenza A  Discharge Instructions       Drink plenty of fluids.  Rest as much as possible.  Use Tylenol or Motrin for fever chills or body aches.    ED Prescriptions    Medication Sig Dispense Auth. Provider   oseltamivir (TAMIFLU) 75 MG capsule Take 1 capsule (75 mg total) by mouth every 12 (twelve) hours. 10 capsule Lutricia Feil, PA-C   benzonatate (TESSALON) 200 MG capsule Take one cap TID PRN cough 30 capsule Lutricia Feil, PA-C     Controlled Substance Prescriptions [place] Controlled Substance Registry consulted? Not Applicable   Lutricia Feil, PA-C 06/23/18 1704

## 2018-06-23 NOTE — ED Triage Notes (Signed)
Patient c/o cough, congestion and fever that started on Friday.   

## 2019-08-30 IMAGING — CR DG HAND COMPLETE 3+V*R*
3 series · 3 of 3 positions shown · non-contrast
Comparison: None.

CLINICAL DATA: Unknown bite. Pain and swelling.

EXAM:
RIGHT HAND - COMPLETE 3+ VIEW

[hand ap]
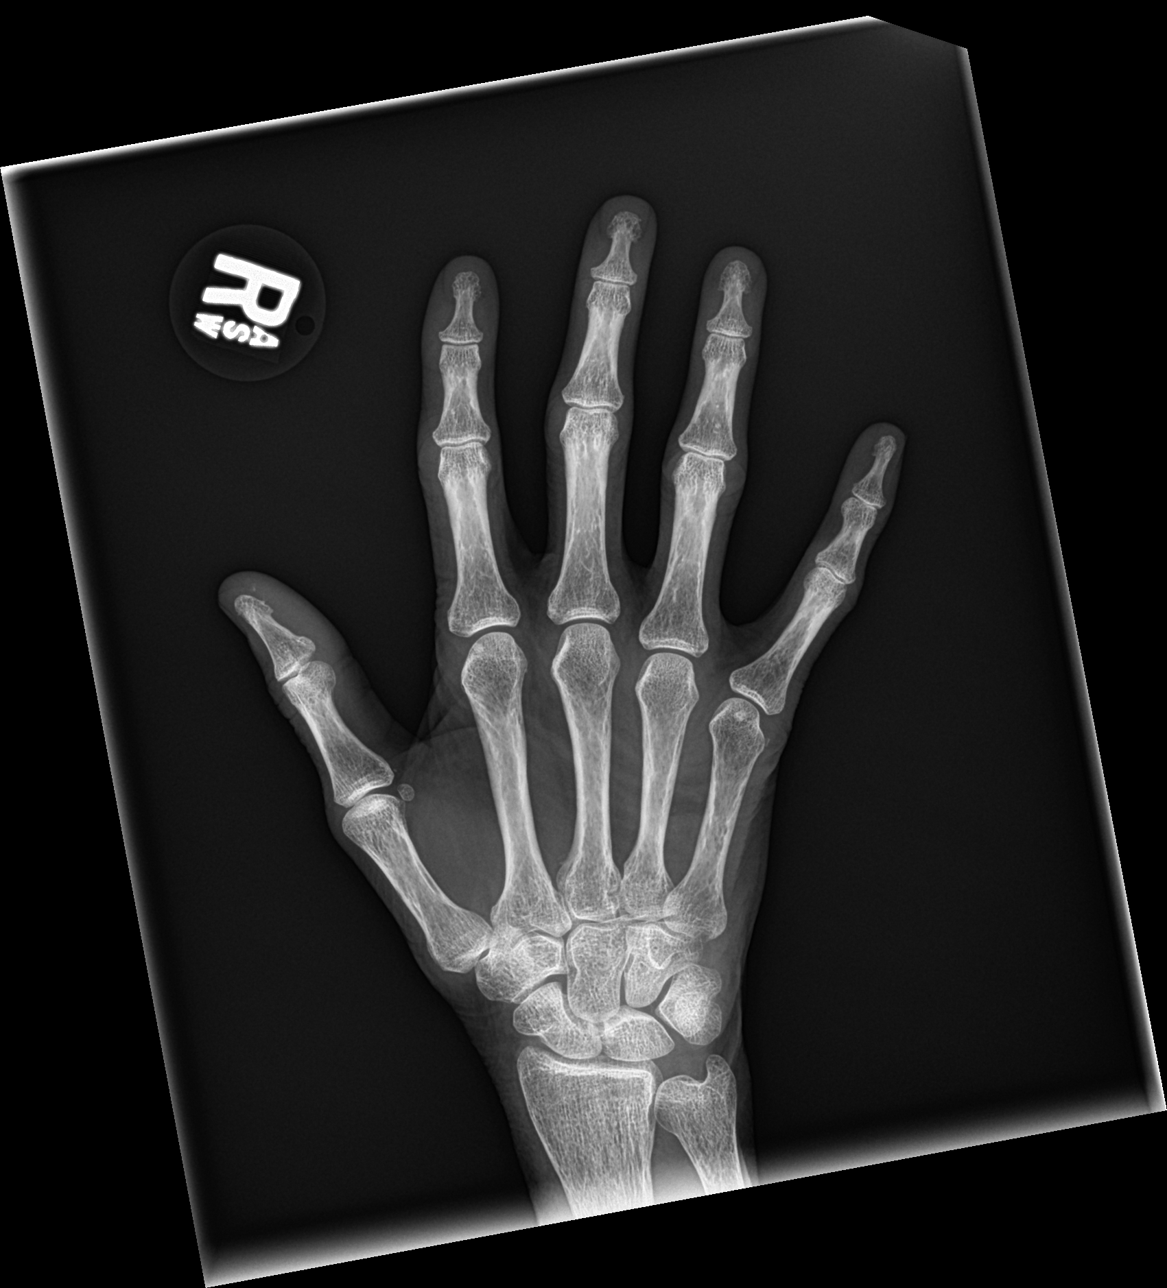

[hand obl]
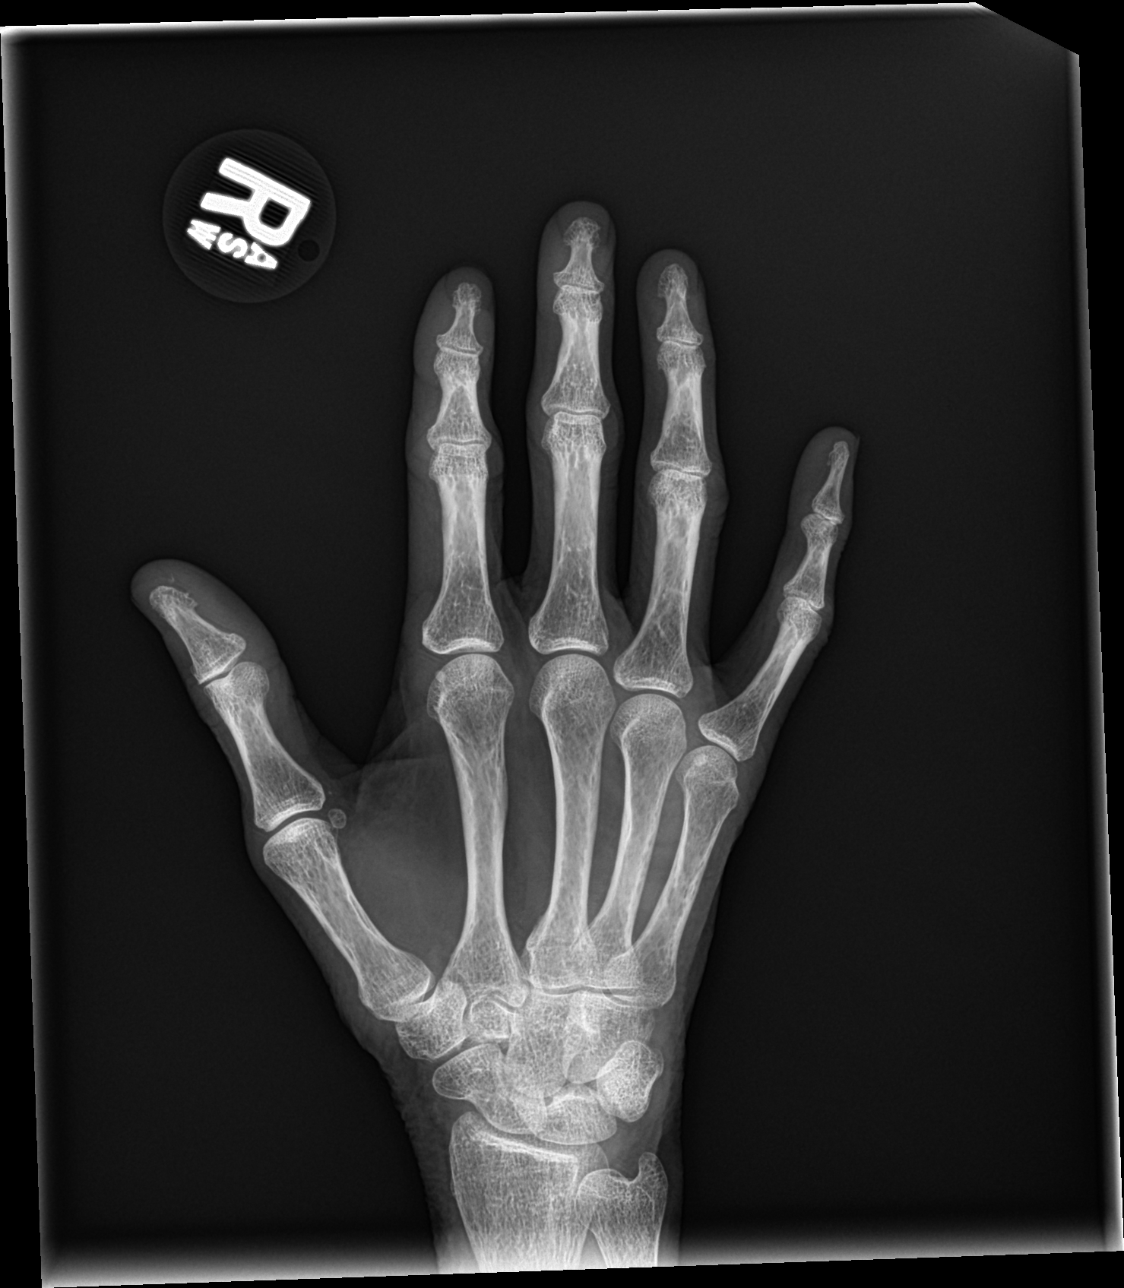

[hand lat]
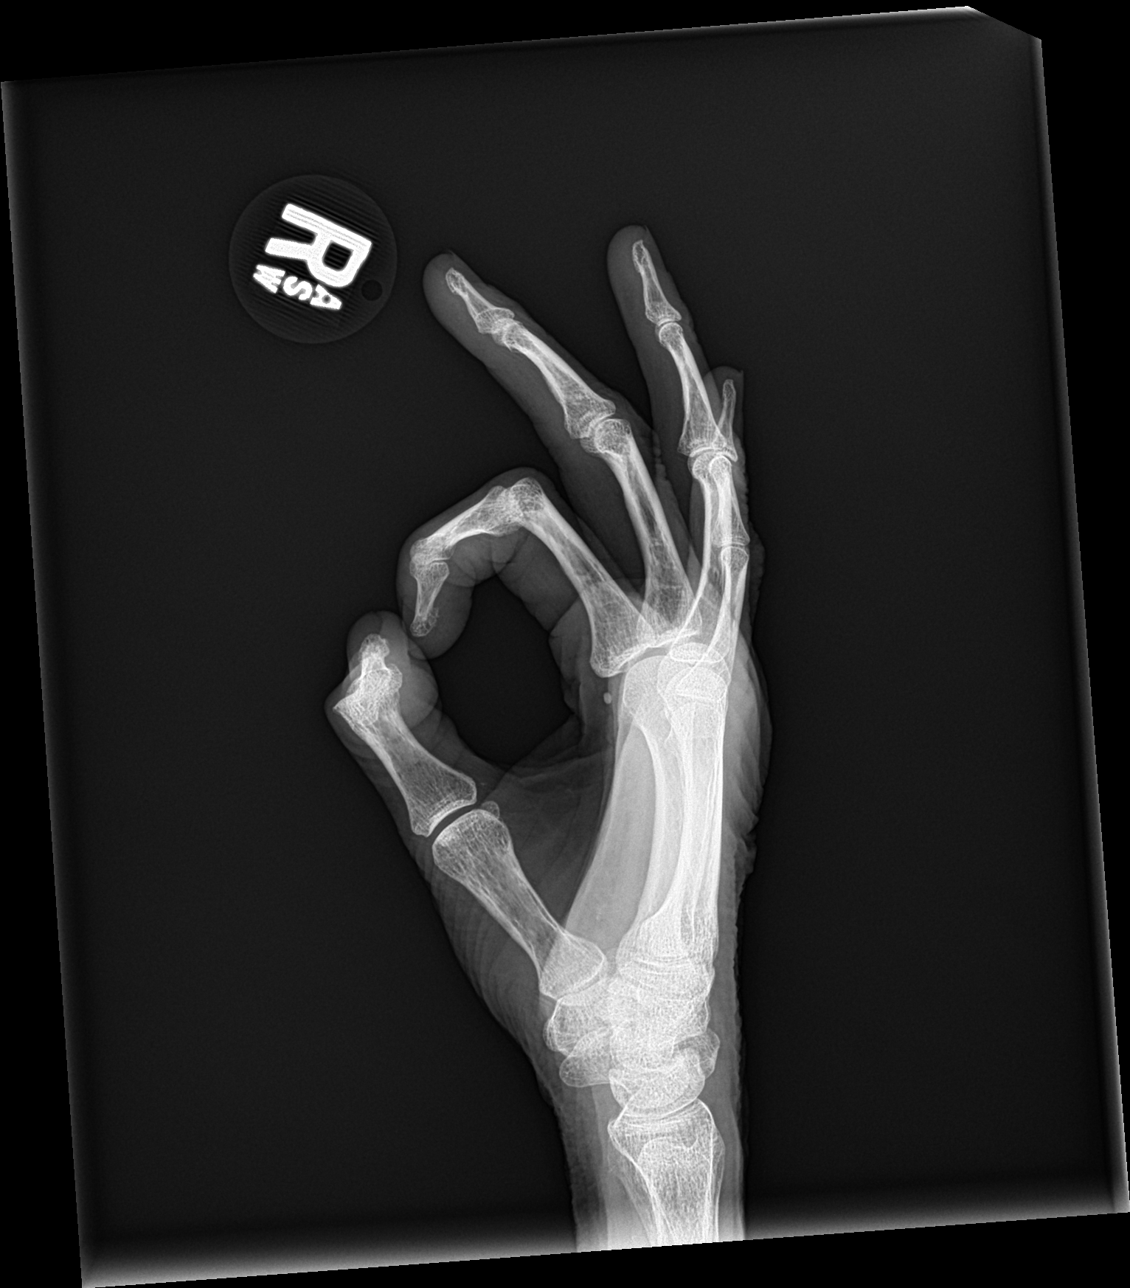

[3 of 3 positions shown; findings below may reference images not displayed]

FINDINGS: There is no evidence of fracture or dislocation. There is no
evidence of arthropathy or other focal bone abnormality. Dorsal soft
tissue swelling.
IMPRESSION: Negative for fracture or osseous lesion..

## 2022-03-11 ENCOUNTER — Ambulatory Visit (INDEPENDENT_AMBULATORY_CARE_PROVIDER_SITE_OTHER): Payer: BC Managed Care – PPO

## 2022-03-11 ENCOUNTER — Encounter: Payer: Self-pay | Admitting: Emergency Medicine

## 2022-03-11 ENCOUNTER — Ambulatory Visit
Admission: EM | Admit: 2022-03-11 | Discharge: 2022-03-11 | Disposition: A | Discharge status: Home / Self Care | Payer: BC Managed Care – PPO | Attending: Emergency Medicine | Admitting: Emergency Medicine

## 2022-03-11 DIAGNOSIS — J209 Acute bronchitis, unspecified: Secondary | ICD-10-CM

## 2022-03-11 DIAGNOSIS — R062 Wheezing: Secondary | ICD-10-CM

## 2022-03-11 DIAGNOSIS — J019 Acute sinusitis, unspecified: Secondary | ICD-10-CM

## 2022-03-11 DIAGNOSIS — R059 Cough, unspecified: Secondary | ICD-10-CM | POA: Diagnosis not present

## 2022-03-11 MED ORDER — IPRATROPIUM-ALBUTEROL 0.5-2.5 (3) MG/3ML IN SOLN
3.0000 mL | Freq: Once | RESPIRATORY_TRACT | Status: AC
  Administered 2022-03-11: 3 mL via RESPIRATORY_TRACT

## 2022-03-11 MED ORDER — AEROCHAMBER MV MISC: 1 refills | Status: AC

## 2022-03-11 MED ORDER — AMOXICILLIN-POT CLAVULANATE 875-125 MG PO TABS: 1.0000 | ORAL_TABLET | Freq: Two times a day (BID) | ORAL | 0 refills | Status: DC

## 2022-03-11 MED ORDER — FLUTICASONE PROPIONATE 50 MCG/ACT NA SUSP: 2.0000 | Freq: Every day | NASAL | 0 refills | Status: DC

## 2022-03-11 MED ORDER — PREDNISONE 20 MG PO TABS: 40.0000 mg | ORAL_TABLET | Freq: Every day | ORAL | 0 refills | Status: AC

## 2022-03-11 MED ORDER — ALBUTEROL SULFATE HFA 108 (90 BASE) MCG/ACT IN AERS: 1.0000 | INHALATION_SPRAY | RESPIRATORY_TRACT | 0 refills | Status: DC | PRN

## 2022-03-11 NOTE — Discharge Instructions (Addendum)
2 puffs from your albuterol inhaler using your spacer every 4 hours for 2 days, then every 6 hours for 2 days, then as needed.  You can back off on the albuterol if you start to improve sooner.  40 mg of prednisone for 5 days.  Hold your 10 mg dose of prednisone while you are taking the 40 mg.  This is for bronchitis.  Finish the Augmentin, even if you feel better.  Saline nasal irrigation, with a NeilMed sinus rinse and distilled water as often as you want, Flonase, Mucinex D for the nasal congestion/sinus infection.

## 2022-03-11 NOTE — ED Triage Notes (Signed)
Patient c/o cough and chest congestion for a week.  Patient states home covid test on Thursday was negative.  Patient denies fevers.

## 2022-03-11 NOTE — ED Provider Notes (Signed)
HPI  SUBJECTIVE:  Ann Roman is a 61 y.o. female who presents with headache, body aches, nasal congestion, rhinorrhea, sinus pain and pressure, postnasal drip, wheezing, and a dry cough for the past week.  She states that her symptoms have "settled in her chest".  No fevers, facial swelling, upper dental pain, shortness of breath, dyspnea on exertion.  No GERD or allergy symptoms.  She is able to sleep at night without waking up coughing.  She had a negative home COVID test 4 days ago.  No antibiotics in the past 3 months.  She took Tylenol within 6 hours of evaluation.  She has also been taking Mucinex.  No aggravating or alleviating factors.  She has a past medical history of COVID, GERD, allergies, ulcerative colitis with a colostomy on chronic prednisone, cirrhosis, but states that her liver is working well.  She states that her GERD and allergies have not been bothering her recently.  No history of pulmonary disease, smoking.  PCP: [facility].    Past Medical History:  Diagnosis Date   Anxiety    Colitis, ulcerative (HCC)    Depression (emotion)    GERD (gastroesophageal reflux disease)    HPV (human papilloma virus) infection    Liver cirrhosis (HCC)    Osteoporosis     Past Surgical History:  Procedure Laterality Date   CHOLECYSTECTOMY     COLON SURGERY     EYE SURGERY      Family History  Problem Relation Age of Onset   Dementia Mother    Heart disease Father    Glaucoma Father     Social History   Tobacco Use   Smoking status: Never   Smokeless tobacco: Never  Vaping Use   Vaping Use: Never used  Substance Use Topics   Alcohol use: No   Drug use: Never    No current facility-administered medications for this encounter.  Current Outpatient Medications:    Cholecalciferol (VITAMIN D3) 25 MCG (1000 UT) CAPS, Take by mouth., Disp: , Rfl:    citalopram (CELEXA) 10 MG tablet, Take 15 mg by mouth every morning., Disp: , Rfl:    omeprazole (PRILOSEC)  20 MG capsule, Take 20 mg by mouth daily., Disp: , Rfl:    predniSONE (DELTASONE) 10 MG tablet, Take 10 mg by mouth daily with breakfast., Disp: , Rfl:    albuterol (VENTOLIN HFA) 108 (90 Base) MCG/ACT inhaler, Inhale 1-2 puffs into the lungs every 4 (four) hours as needed for wheezing or shortness of breath., Disp: 1 each, Rfl: 0   amoxicillin-clavulanate (AUGMENTIN) 875-125 MG tablet, Take 1 tablet by mouth every 12 (twelve) hours., Disp: 14 tablet, Rfl: 0   FLUoxetine (PROZAC) 20 MG capsule, Take by mouth., Disp: , Rfl:    fluticasone (FLONASE) 50 MCG/ACT nasal spray, Place 2 sprays into both nostrils daily., Disp: 16 g, Rfl: 0   predniSONE (DELTASONE) 20 MG tablet, Take 2 tablets (40 mg total) by mouth daily with breakfast for 5 days., Disp: 10 tablet, Rfl: 0   Spacer/Aero-Holding Chambers (AEROCHAMBER MV) inhaler, Use as instructed, Disp: 1 each, Rfl: 1  Allergies  Allergen Reactions   Oxycodone Nausea Only   Promethazine Anxiety   Sulfa Antibiotics Other (See Comments)    Causes liver problems.     ROS  As noted in HPI.   Physical Exam  BP (!) 145/90 (BP Location: Left Arm)   Pulse 91   Temp 98.3 F (36.8 C) (Oral)   Resp 14  Ht 5\' 1"  (1.549 m)   Wt 51.3 kg   SpO2 97%   BMI 21.35 kg/m   Constitutional: Well developed, well nourished, no acute distress Eyes:  EOMI, conjunctiva normal bilaterally HENT: Normocephalic, atraumatic,mucus membranes moist.  Positive nasal congestion.  Purulent nasal drainage.  Erythematous, swollen turbinates.  No maxillary, frontal sinus tenderness.  No postnasal drip. Respiratory: Normal inspiratory effort , diffuse wheezing throughout all lung fields.  Occasional rhonchi.  No anterior, lateral chest wall tenderness Cardiovascular: Normal rate, regular rhythm, no murmurs, rubs, gallops GI: nondistended skin: No rash, skin intact Musculoskeletal: no deformities Neurologic: Alert & oriented x 3, no focal neuro deficits Psychiatric: Speech  and behavior appropriate   ED Course   Medications  ipratropium-albuterol (DUONEB) 0.5-2.5 (3) MG/3ML nebulizer solution 3 mL (3 mLs Nebulization Given 03/11/22 1329)    Orders Placed This Encounter  Procedures   DG Chest 2 View    Standing Status:   Standing    Number of Occurrences:   1    Order Specific Question:   Reason for Exam (SYMPTOM  OR DIAGNOSIS REQUIRED)    Answer:   Cough, diffuse wheezing, immunocompromise.  Rule out pneumonia    No results found for this or any previous visit (from the past 24 hour(s)). DG Chest 2 View  Result Date: 03/11/2022 CLINICAL DATA:  Cough, wheezing EXAM: CHEST - 2 VIEW COMPARISON:  None Available. FINDINGS: Cardiac size is within normal limits. Increase in AP diameter of chest suggests COPD. There are no signs of pulmonary edema or focal pulmonary consolidation. Linear density seen in the anterior lower lung fields in the lateral view may suggest confluence of normal bronchovascular structures or minimal scarring. There is no pleural effusion or pneumothorax. IMPRESSION: No active cardiopulmonary disease. Electronically Signed   By: 03/13/2022 M.D.   On: 03/11/2022 13:34    ED Clinical Impression  1. Acute non-recurrent sinusitis, unspecified location   2. Acute bronchitis, unspecified organism      ED Assessment/Plan     Patient with extensive wheezing and some rhonchi.  She is immunocompromised.  Checking chest x-ray to rule out pneumonia and giving DuoNeb.  Will reevaluate.  Reviewed imaging independently.  No pneumonia.  Increased AP diameter of chest, suggestive of COPD.  See radiology report for full details.  On reevaluation post nebulizer, patient states that she feels better.  Improved air movement.  No wheezing.  Presentation concerning for sinusitis and bronchitis.  Will send home with Augmentin 875 mg for 7 days, saline nasal irrigation, Flonase, Mucinex D for the sinusitis, regularly scheduled albuterol inhaler  with a spacer for 4 days then as needed and prednisone 40 mg for 5 days for bronchitis.  Follow-up with PCP.  ER return precautions given.  Discussed imaging, MDM, treatment plan, and plan for follow-up with patient. Discussed sn/sx that should prompt return to the ED. patient agrees with plan.   Meds ordered this encounter  Medications   ipratropium-albuterol (DUONEB) 0.5-2.5 (3) MG/3ML nebulizer solution 3 mL   fluticasone (FLONASE) 50 MCG/ACT nasal spray    Sig: Place 2 sprays into both nostrils daily.    Dispense:  16 g    Refill:  0   amoxicillin-clavulanate (AUGMENTIN) 875-125 MG tablet    Sig: Take 1 tablet by mouth every 12 (twelve) hours.    Dispense:  14 tablet    Refill:  0   albuterol (VENTOLIN HFA) 108 (90 Base) MCG/ACT inhaler    Sig: Inhale 1-2 puffs into  the lungs every 4 (four) hours as needed for wheezing or shortness of breath.    Dispense:  1 each    Refill:  0   Spacer/Aero-Holding Chambers (AEROCHAMBER MV) inhaler    Sig: Use as instructed    Dispense:  1 each    Refill:  1   predniSONE (DELTASONE) 20 MG tablet    Sig: Take 2 tablets (40 mg total) by mouth daily with breakfast for 5 days.    Dispense:  10 tablet    Refill:  0      *This clinic note was created using [employer]. Therefore, there may be occasional mistakes despite careful proofreading.  ?    Melynda Ripple, MD 03/12/22 [phone]

## 2022-08-15 ENCOUNTER — Encounter: Payer: Self-pay | Admitting: Emergency Medicine

## 2022-08-15 ENCOUNTER — Ambulatory Visit
Admission: EM | Admit: 2022-08-15 | Discharge: 2022-08-15 | Disposition: A | Discharge status: Home / Self Care | Payer: BC Managed Care – PPO

## 2022-08-15 ENCOUNTER — Ambulatory Visit (INDEPENDENT_AMBULATORY_CARE_PROVIDER_SITE_OTHER): Payer: BC Managed Care – PPO

## 2022-08-15 DIAGNOSIS — M79672 Pain in left foot: Secondary | ICD-10-CM | POA: Diagnosis not present

## 2022-08-15 DIAGNOSIS — M25572 Pain in left ankle and joints of left foot: Secondary | ICD-10-CM

## 2022-08-15 DIAGNOSIS — S92352A Displaced fracture of fifth metatarsal bone, left foot, initial encounter for closed fracture: Secondary | ICD-10-CM

## 2022-08-15 DIAGNOSIS — W19XXXA Unspecified fall, initial encounter: Secondary | ICD-10-CM

## 2022-08-15 NOTE — ED Triage Notes (Addendum)
Pt fell today today and injured her left foot.

## 2022-08-15 NOTE — Discharge Instructions (Addendum)
-  X-rays shows a foot fracture -Use this boot as a cast and avoid weightbearing -Ice and elevate foot.  -OTC  meds as needed for pain -Contact ortho for follow up  You have a condition requiring you to follow up with Orthopedics so please call one of the following office for appointment:   [facility] [address] [place], [place] [address] Phone: [phone]  [facility] [address] [place],  [address] Phone: [phone]

## 2022-08-15 NOTE — ED Provider Notes (Signed)
[facility]    CSN: [id number] Arrival date & time: 08/15/22  1641      History   Chief Complaint Chief Complaint  Patient presents with   Fall   Foot Pain    HPI Ann Roman is a 62 y.o. female presenting for left foot and ankle pain after accidental fall that occurred today when she stepped off a curb.  She denies any other injuries.  She has significant bruising and swelling of her left foot.  Reports increased pain when she tries to walk and bear weight but is able to do so.  No numbness or weakness.  Has not taken anything for pain relief.  Has applied ice.  Denies any previous fractures or major injury of this foot.  No other complaints.  HPI  Past Medical History:  Diagnosis Date   Anxiety    Colitis, ulcerative    Depression (emotion)    GERD (gastroesophageal reflux disease)    HPV (human papilloma virus) infection    Liver cirrhosis    Osteoporosis     There are no problems to display for this patient.   Past Surgical History:  Procedure Laterality Date   CHOLECYSTECTOMY     COLON SURGERY     EYE SURGERY      OB History   No obstetric history on file.      Home Medications    Prior to Admission medications   Medication Sig Start Date End Date Taking? Authorizing Provider  traZODone (DESYREL) 50 MG tablet Take 25 mg by mouth at bedtime as needed. 07/23/22  Yes [provider]  ursodiol (ACTIGALL) 300 MG capsule Take by mouth. 08/06/22  Yes [provider]  albuterol (VENTOLIN HFA) 108 (90 Base) MCG/ACT inhaler Inhale 1-2 puffs into the lungs every 4 (four) hours as needed for wheezing or shortness of breath. 03/11/22   Melynda Ripple, MD  amoxicillin-clavulanate (AUGMENTIN) 875-125 MG tablet Take 1 tablet by mouth every 12 (twelve) hours. 03/11/22   Melynda Ripple, MD  Cholecalciferol (VITAMIN D3) 25 MCG (1000 UT) CAPS Take by mouth.    [provider]  citalopram (CELEXA) 10 MG tablet Take 15 mg by mouth  every morning. 01/11/22   [provider]  FLUoxetine (PROZAC) 20 MG capsule Take by mouth. 10/31/17   [provider]  fluticasone (FLONASE) 50 MCG/ACT nasal spray Place 2 sprays into both nostrils daily. 03/11/22   Melynda Ripple, MD  omeprazole (PRILOSEC) 20 MG capsule Take 20 mg by mouth daily.    [provider]  predniSONE (DELTASONE) 10 MG tablet Take 10 mg by mouth daily with breakfast.    [provider]  Spacer/Aero-Holding Chambers (AEROCHAMBER MV) inhaler Use as instructed 03/11/22   Melynda Ripple, MD    Family History Family History  Problem Relation Age of Onset   Dementia Mother    Heart disease Father    Glaucoma Father     Social History Social History   Tobacco Use   Smoking status: Never   Smokeless tobacco: Never  Vaping Use   Vaping Use: Never used  Substance Use Topics   Alcohol use: No   Drug use: Never     Allergies   Oxycodone, Promethazine, and Sulfa antibiotics   Review of Systems Review of Systems  Musculoskeletal:  Positive for arthralgias, gait problem and joint swelling.  Skin:  Positive for color change. Negative for wound.  Neurological:  Negative for weakness and numbness.  Physical Exam Triage Vital Signs ED Triage Vitals  Enc Vitals Group     BP      Pulse      Resp      Temp      Temp src      SpO2      Weight      Height      Head Circumference      Peak Flow      Pain Score      Pain Loc      Pain Edu?      Excl. in [place]?    No data found.  Updated Vital Signs BP (!) 144/93 (BP Location: Left Arm)   Pulse 70   Temp 98.4 F (36.9 C) (Oral)   Resp 16   SpO2 98%       Physical Exam Vitals and nursing note reviewed.  Constitutional:      General: She is not in acute distress.    Appearance: Normal appearance. She is not ill-appearing or toxic-appearing.  HENT:     Head: Normocephalic and atraumatic.  Eyes:     General: No scleral icterus.       Right eye: No  discharge.        Left eye: No discharge.     Conjunctiva/sclera: Conjunctivae normal.  Cardiovascular:     Rate and Rhythm: Normal rate and regular rhythm.     Heart sounds: Normal heart sounds.  Pulmonary:     Effort: Pulmonary effort is normal. No respiratory distress.     Breath sounds: Normal breath sounds.  Musculoskeletal:     Cervical back: Neck supple.     Left ankle: No swelling. Tenderness present over the medial malleolus. Normal range of motion.     Left foot: Decreased range of motion. Swelling (mild swelling dorsal foot with significant ecchymosis of 75% of dorsal foot) and tenderness (TTP throughout metatarsals 3, 4, 5) present.  Skin:    General: Skin is dry.  Neurological:     General: No focal deficit present.     Mental Status: She is alert. Mental status is at baseline.     Motor: No weakness.     Gait: Gait abnormal.  Psychiatric:        Mood and Affect: Mood normal.        Behavior: Behavior normal.        Thought Content: Thought content normal.      UC Treatments / Results  Labs (all labs ordered are listed, but only abnormal results are displayed) Labs Reviewed - No data to display  EKG   Radiology DG Ankle Complete Left  Result Date: 08/15/2022 CLINICAL DATA:  Fall.  Left ankle and foot pain. EXAM: LEFT ANKLE COMPLETE - 3+ VIEW; LEFT FOOT - COMPLETE 3+ VIEW COMPARISON:  None Available. FINDINGS: Left ankle: The ankle mortise is symmetric and intact. Joint space is preserved. Mild chronic spurring at the dorsal aspect of the talar neck. There is a horizontal lucency at the far dorsal aspect of the navicular that is suspicious for an acute to subacute avulsion injury. Recommend clinical correlation for point tenderness. Small calcaneal heel spur. Moderate to high-grade atherosclerotic calcifications. Vascular phleboliths also overlie the distal anterior shin. Left foot: There is an oblique linear lucency indicating an acute nondisplaced fracture of the  lateral base of fifth metatarsal extending through the lateral and plantar cortices. No definite intra-articular extension. IMPRESSION: 1. Acute nondisplaced fracture of the lateral base of  the fifth metatarsal. 2. There is a horizontal lucency at the far dorsal aspect of the navicular that is suspicious for an acute to subacute avulsion injury. Recommend clinical correlation for point tenderness. Electronically Signed   By: Yvonne Kendall M.D.   On: 08/15/2022 17:57   DG Foot Complete Left  Result Date: 08/15/2022 CLINICAL DATA:  Fall.  Left ankle and foot pain. EXAM: LEFT ANKLE COMPLETE - 3+ VIEW; LEFT FOOT - COMPLETE 3+ VIEW COMPARISON:  None Available. FINDINGS: Left ankle: The ankle mortise is symmetric and intact. Joint space is preserved. Mild chronic spurring at the dorsal aspect of the talar neck. There is a horizontal lucency at the far dorsal aspect of the navicular that is suspicious for an acute to subacute avulsion injury. Recommend clinical correlation for point tenderness. Small calcaneal heel spur. Moderate to high-grade atherosclerotic calcifications. Vascular phleboliths also overlie the distal anterior shin. Left foot: There is an oblique linear lucency indicating an acute nondisplaced fracture of the lateral base of fifth metatarsal extending through the lateral and plantar cortices. No definite intra-articular extension. IMPRESSION: 1. Acute nondisplaced fracture of the lateral base of the fifth metatarsal. 2. There is a horizontal lucency at the far dorsal aspect of the navicular that is suspicious for an acute to subacute avulsion injury. Recommend clinical correlation for point tenderness. Electronically Signed   By: Yvonne Kendall M.D.   On: 08/15/2022 17:57    Procedures Procedures (including critical care time)  Medications Ordered in UC Medications - No data to display  Initial Impression / Assessment and Plan / UC Course  I have reviewed the triage vital signs and the nursing  notes.  Pertinent labs & imaging results that were available during my care of the patient were reviewed by me and considered in my medical decision making (see chart for details).    62 year old female presents for left foot and ankle pain and swelling after accidental fall today.  X-ray of foot and ankle obtained today.  X-ray shows acute nondisplaced fracture of the base of the metatarsal.  Also possible avulsion navicular bone.  Patient has tenderness in this area as well.  Patient placed in cam boot.  Reviewed RICE guidelines.  Reviewed use of OTC meds and low doses as needed for pain.  Reviewed nonweightbearing and follow-up with Ortho.  Contact information given.   Final Clinical Impressions(s) / UC Diagnoses   Final diagnoses:  Closed fracture of base of fifth metatarsal bone of left foot, initial encounter  Foot pain, left     Discharge Instructions      -X-rays shows a foot fracture -Use this boot as a cast and avoid weightbearing -Ice and elevate foot.  -OTC  meds as needed for pain -Contact ortho for follow up  You have a condition requiring you to follow up with Orthopedics so please call one of the following office for appointment:   [facility] [address] [place], [place] [address] Phone: [phone]  [facility] [address] [place],  [address] Phone: [phone]      ED Prescriptions   None    PDMP not reviewed this encounter.   Danton Clap, PA-C 08/15/22 1811

## 2023-06-17 ENCOUNTER — Encounter: Payer: Self-pay | Admitting: Emergency Medicine

## 2023-06-17 ENCOUNTER — Ambulatory Visit
Admission: EM | Admit: 2023-06-17 | Discharge: 2023-06-17 | Disposition: A | Discharge status: Home / Self Care | Payer: 59 | Attending: Emergency Medicine | Admitting: Emergency Medicine

## 2023-06-17 DIAGNOSIS — S81812A Laceration without foreign body, left lower leg, initial encounter: Secondary | ICD-10-CM | POA: Diagnosis not present

## 2023-06-17 DIAGNOSIS — Z203 Contact with and (suspected) exposure to rabies: Secondary | ICD-10-CM | POA: Diagnosis not present

## 2023-06-17 MED ORDER — CEPHALEXIN 500 MG PO CAPS: 500.0000 mg | ORAL_CAPSULE | Freq: Three times a day (TID) | ORAL | 0 refills | Status: AC

## 2023-06-17 MED ORDER — TETANUS-DIPHTH-ACELL PERTUSSIS 5-2.5-18.5 LF-MCG/0.5 IM SUSY
0.5000 mL | PREFILLED_SYRINGE | Freq: Once | INTRAMUSCULAR | Status: AC
  Administered 2023-06-17: 0.5 mL via INTRAMUSCULAR

## 2023-06-17 NOTE — ED Triage Notes (Signed)
Pt hit a box in her bedroom and has a skin tear to her left shin 3 days ago.

## 2023-06-17 NOTE — ED Provider Notes (Signed)
[facility]    CSN: [id number] Arrival date & time: 06/17/23  1125      History   Chief Complaint Chief Complaint  Patient presents with   skin tear     HPI Ann Roman is a 63 y.o. female.   HPI  63 year old female with past medical history significant for cirrhosis, HPV, GERD, UC, anxiety, and depression presents for evaluation of a skin tear to her left shin that she sustained 3 days ago.  She reports that she had a box in her room.  She has been a applying Neosporin to the area but she came in and they request of her daughter who is concerned about possible infection because it is oozing a clear yellow fluid.  She denies any fever.  Patient is unsure when her last tetanus shot was.  Past Medical History:  Diagnosis Date   Anxiety    Colitis, ulcerative (HCC)    Depression (emotion)    GERD (gastroesophageal reflux disease)    HPV (human papilloma virus) infection    Liver cirrhosis (HCC)    Osteoporosis     There are no active problems to display for this patient.   Past Surgical History:  Procedure Laterality Date   CHOLECYSTECTOMY     COLON SURGERY     EYE SURGERY      OB History   No obstetric history on file.      Home Medications    Prior to Admission medications   Medication Sig Start Date End Date Taking? Authorizing Provider  cephALEXin (KEFLEX) 500 MG capsule Take 1 capsule (500 mg total) by mouth 3 (three) times daily for 7 days. 06/17/23 06/24/23 Yes Becky Augusta, NP  albuterol (VENTOLIN HFA) 108 (90 Base) MCG/ACT inhaler Inhale 1-2 puffs into the lungs every 4 (four) hours as needed for wheezing or shortness of breath. 03/11/22   Domenick Gong, MD  Cholecalciferol (VITAMIN D3) 25 MCG (1000 UT) CAPS Take by mouth.    [provider]  citalopram (CELEXA) 10 MG tablet Take 15 mg by mouth every morning. 01/11/22   [provider]  FLUoxetine (PROZAC) 20 MG capsule Take by mouth. 10/31/17   [provider]   fluticasone (FLONASE) 50 MCG/ACT nasal spray Place 2 sprays into both nostrils daily. 03/11/22   Domenick Gong, MD  omeprazole (PRILOSEC) 20 MG capsule Take 20 mg by mouth daily.    [provider]  predniSONE (DELTASONE) 10 MG tablet Take 10 mg by mouth daily with breakfast.    [provider]  Spacer/Aero-Holding Chambers (AEROCHAMBER MV) inhaler Use as instructed 03/11/22   Domenick Gong, MD  traZODone (DESYREL) 50 MG tablet Take 25 mg by mouth at bedtime as needed. 07/23/22   [provider]  ursodiol (ACTIGALL) 300 MG capsule Take by mouth. 08/06/22   [provider]    Family History Family History  Problem Relation Age of Onset   Dementia Mother    Heart disease Father    Glaucoma Father     Social History Social History   Tobacco Use   Smoking status: Never   Smokeless tobacco: Never  Vaping Use   Vaping status: Never Used  Substance Use Topics   Alcohol use: No   Drug use: Never     Allergies   Oxycodone, Promethazine, and Sulfa antibiotics   Review of Systems Review of Systems  Constitutional:  Negative for fever.  Skin:  Positive for color change and wound.  Physical Exam Triage Vital Signs ED Triage Vitals  Encounter Vitals Group     BP 06/17/23 1320 (!) 159/101     Systolic BP Percentile --      Diastolic BP Percentile --      Pulse Rate 06/17/23 1320 73     Resp 06/17/23 1320 18     Temp 06/17/23 1320 97.7 F (36.5 C)     Temp Source 06/17/23 1320 Oral     SpO2 06/17/23 1320 98 %     Weight --      Height --      Head Circumference --      Peak Flow --      Pain Score 06/17/23 1319 0     Pain Loc --      Pain Education --      Exclude from Growth Chart --    No data found.  Updated Vital Signs BP (!) 159/101 (BP Location: Right Arm)   Pulse 73   Temp 97.7 F (36.5 C) (Oral)   Resp 18   SpO2 98%   Visual Acuity Right Eye Distance:   Left Eye Distance:   Bilateral Distance:     Right Eye Near:   Left Eye Near:    Bilateral Near:     Physical Exam Vitals and nursing note reviewed.  Constitutional:      Appearance: Normal appearance.  Musculoskeletal:        General: Signs of injury present.  Skin:    General: Skin is warm and dry.     Capillary Refill: Capillary refill takes less than 2 seconds.  Neurological:     General: No focal deficit present.     Mental Status: She is alert and oriented to person, place, and time.      UC Treatments / Results  Labs (all labs ordered are listed, but only abnormal results are displayed) Labs Reviewed - No data to display  EKG   Radiology No results found.  Procedures Procedures (including critical care time)  Medications Ordered in UC Medications  Tdap (BOOSTRIX) injection 0.5 mL (has no administration in time range)    Initial Impression / Assessment and Plan / UC Course  I have reviewed the triage vital signs and the nursing notes.  Pertinent labs & imaging results that were available during my care of the patient were reviewed by me and considered in my medical decision making (see chart for details).   She is a pleasant, nontoxic-appearing 63 year old female presenting with request for evaluation of a skin tear to the anterior aspect of her mid left shin.  As you can see in image above, there is a crescent shaped skin tear with some ecchymosis of the skin flap and some serous drainage.  No surrounding erythema, induration, or fluctuance.  Also no red streaking to suggest infection.  I will have staff clean the wound and apply a nonstick dressing with bacitracin ointment.  Have advised the patient to keep her wound clean and to apply bacitracin ointment twice daily to help the wound heal.  I will also have staff update her tetanus shot prior to discharge.  Additionally, I will place her on Keflex 500 mg 3 times daily x 7 days empirically to prevent infection.  Return precautions reviewed.  Final  Clinical Impressions(s) / UC Diagnoses   Final diagnoses:  Noninfected skin tear of leg, left, initial encounter     Discharge Instructions      Keep the skin tear  on your left shin clean and dry.  Apply bacitracin ointment and a nonstick dressing twice daily until the wound heals or a scab has formed.  Once the scab is formed you can leave the wound open to air when at home and cover with a dry dressing when out in public.  Take the Keflex 500 mg 3 times a day with food for 7 days to prevent infection.  If you develop any increased redness, swelling, red streaks going up your leg, pus drainage, or fever please return for reevaluation, see your primary care provider, or seek care in the ER.     ED Prescriptions     Medication Sig Dispense Auth. Provider   cephALEXin (KEFLEX) 500 MG capsule Take 1 capsule (500 mg total) by mouth 3 (three) times daily for 7 days. 21 capsule Becky Augusta, NP      PDMP not reviewed this encounter.   Becky Augusta, NP 06/17/23 [phone]

## 2023-06-17 NOTE — Discharge Instructions (Addendum)
Keep the skin tear on your left shin clean and dry.  Apply bacitracin ointment and a nonstick dressing twice daily until the wound heals or a scab has formed.  Once the scab is formed you can leave the wound open to air when at home and cover with a dry dressing when out in public.  Take the Keflex 500 mg 3 times a day with food for 7 days to prevent infection.  If you develop any increased redness, swelling, red streaks going up your leg, pus drainage, or fever please return for reevaluation, see your primary care provider, or seek care in the ER.

## 2023-06-23 ENCOUNTER — Ambulatory Visit
Admission: EM | Admit: 2023-06-23 | Discharge: 2023-06-23 | Disposition: A | Discharge status: Home / Self Care | Payer: 59 | Attending: Family Medicine | Admitting: Family Medicine

## 2023-06-23 ENCOUNTER — Encounter: Payer: Self-pay | Admitting: Emergency Medicine

## 2023-06-23 DIAGNOSIS — J101 Influenza due to other identified influenza virus with other respiratory manifestations: Secondary | ICD-10-CM | POA: Diagnosis present

## 2023-06-23 DIAGNOSIS — R030 Elevated blood-pressure reading, without diagnosis of hypertension: Secondary | ICD-10-CM

## 2023-06-23 LAB — RESP PANEL BY RT-PCR (FLU A&B, COVID) ARPGX2
Influenza A by PCR: POSITIVE — AB
Influenza B by PCR: NEGATIVE
SARS Coronavirus 2 by RT PCR: NEGATIVE

## 2023-06-23 LAB — GROUP A STREP BY PCR: Group A Strep by PCR: NOT DETECTED

## 2023-06-23 MED ORDER — HYDROCOD POLI-CHLORPHE POLI ER 10-8 MG/5ML PO SUER: 5.0000 mL | Freq: Two times a day (BID) | ORAL | 0 refills | Status: DC | PRN

## 2023-06-23 MED ORDER — OSELTAMIVIR PHOSPHATE 75 MG PO CAPS: 75.0000 mg | ORAL_CAPSULE | Freq: Two times a day (BID) | ORAL | 0 refills | Status: DC

## 2023-06-23 NOTE — ED Provider Notes (Addendum)
 [facility]    CSN: [id number] Arrival date & time: 06/23/23  0830      History   Chief Complaint Chief Complaint  Patient presents with   Cough    HPI Ann Roman is a 63 y.o. female.   HPI  History obtained from the patient. Ann Roman presents for cough, rhinorrhea, sore throat, headache, nausea and vomiting that started Friday.  Of note, her granddaughter, her her daughter and her daughter's boyfriend all have influenza.  She often takes care of her granddaughter.  Has been taking over-the-counter medications without relief.  She is not able to sleep due to cough.  Denies fever, diarrhea and ear pain.       Past Medical History:  Diagnosis Date   Anxiety    Colitis, ulcerative (HCC)    Depression (emotion)    GERD (gastroesophageal reflux disease)    HPV (human papilloma virus) infection    Liver cirrhosis (HCC)    Osteoporosis     There are no active problems to display for this patient.   Past Surgical History:  Procedure Laterality Date   CHOLECYSTECTOMY     COLON SURGERY     EYE SURGERY      OB History   No obstetric history on file.      Home Medications    Prior to Admission medications   Medication Sig Start Date End Date Taking? Authorizing Provider  chlorpheniramine-HYDROcodone (TUSSIONEX) 10-8 MG/5ML Take 5 mLs by mouth every 12 (twelve) hours as needed. 06/23/23  Yes Gabriana Wilmott, DO  oseltamivir  (TAMIFLU ) 75 MG capsule Take 1 capsule (75 mg total) by mouth every 12 (twelve) hours. 06/23/23  Yes Yunique Dearcos, DO  albuterol  (VENTOLIN  HFA) 108 (90 Base) MCG/ACT inhaler Inhale 1-2 puffs into the lungs every 4 (four) hours as needed for wheezing or shortness of breath. 03/11/22   Van Knee, MD  cephALEXin  (KEFLEX ) 500 MG capsule Take 1 capsule (500 mg total) by mouth 3 (three) times daily for 7 days. 06/17/23 06/24/23  Bernardino Ditch, NP  Cholecalciferol (VITAMIN D3) 25 MCG (1000 UT) CAPS Take by mouth.    [provider]  citalopram (CELEXA) 10 MG tablet Take 15 mg by mouth every morning. 01/11/22   [provider]  FLUoxetine (PROZAC) 20 MG capsule Take by mouth. 10/31/17   [provider]  fluticasone  (FLONASE ) 50 MCG/ACT nasal spray Place 2 sprays into both nostrils daily. 03/11/22   Van Knee, MD  omeprazole (PRILOSEC) 20 MG capsule Take 20 mg by mouth daily.    [provider]  predniSONE  (DELTASONE ) 10 MG tablet Take 10 mg by mouth daily with breakfast.    [provider]  Spacer/Aero-Holding Chambers (AEROCHAMBER MV) inhaler Use as instructed 03/11/22   Van Knee, MD  traZODone (DESYREL) 50 MG tablet Take 25 mg by mouth at bedtime as needed. 07/23/22   [provider]  ursodiol (ACTIGALL) 300 MG capsule Take by mouth. 08/06/22   [provider]    Family History Family History  Problem Relation Age of Onset   Dementia Mother    Heart disease Father    Glaucoma Father     Social History Social History   Tobacco Use   Smoking status: Never   Smokeless tobacco: Never  Vaping Use   Vaping status: Never Used  Substance Use Topics   Alcohol use: No   Drug use: Never     Allergies   Oxycodone, Promethazine, and Sulfa antibiotics  Review of Systems Review of Systems: negative unless otherwise stated in HPI.      Physical Exam Triage Vital Signs ED Triage Vitals  Encounter Vitals Group     BP 06/23/23 0845 (!) 177/105     Systolic BP Percentile --      Diastolic BP Percentile --      Pulse Rate 06/23/23 0845 81     Resp 06/23/23 0845 15     Temp 06/23/23 0845 99 F (37.2 C)     Temp Source 06/23/23 0845 Oral     SpO2 06/23/23 0845 97 %     Weight 06/23/23 0844 113 lb 1.5 oz (51.3 kg)     Height 06/23/23 0844 5' 1 (1.549 m)     Head Circumference --      Peak Flow --      Pain Score 06/23/23 0844 6     Pain Loc --      Pain Education --      Exclude from Growth Chart --    No data  found.  Updated Vital Signs BP (!) 160/104 (BP Location: Right Arm)   Pulse 81   Temp 99 F (37.2 C) (Oral)   Resp 15   Ht 5' 1 (1.549 m)   Wt 51.3 kg   SpO2 97%   BMI 21.37 kg/m   Visual Acuity Right Eye Distance:   Left Eye Distance:   Bilateral Distance:    Right Eye Near:   Left Eye Near:    Bilateral Near:     Physical Exam GEN:     alert, non-toxic appearing elderly female in no distress    HENT:  mucus membranes moist, oropharyngeal without lesions, mild  erythema, no tonsillar hypertrophy or exudates, clear nasal discharge EYES:  no scleral injection or discharge NECK:  normal ROM, no lymphadenopathy, no meningismus   RESP:  no increased work of breathing, clear to auscultation bilaterally CVS:   regular rate and rhythm Skin:   warm and dry, no rash on visible skin    UC Treatments / Results  Labs (all labs ordered are listed, but only abnormal results are displayed) Labs Reviewed  RESP PANEL BY RT-PCR (FLU A&B, COVID) ARPGX2 - Abnormal; Notable for the following components:      Result Value   Influenza A by PCR POSITIVE (*)    All other components within normal limits  GROUP A STREP BY PCR    EKG   Radiology No results found.  Procedures Procedures (including critical care time)  Medications Ordered in UC Medications - No data to display  Initial Impression / Assessment and Plan / UC Course  I have reviewed the triage vital signs and the nursing notes.  Pertinent labs & imaging results that were available during my care of the patient were reviewed by me and considered in my medical decision making (see chart for details).       Pt is a 63 y.o. female who presents for 2 days of respiratory symptoms. Ann Roman is afebrile here. Satting well on room air. Overall pt is non-toxic appearing, well hydrated, without respiratory distress. Pulmonary exam is unremarkable.  COVID and influenza panel obtained and was influenza A positive.  Strep PCR is  negative.  Tamiflu  risk and benefits discussed and she would like to prescribe with prescription.  Start Tamiflu  75 mg twice daily Tussionex for cough.  Typical duration of symptoms discussed.   Cardiopulmonary exam is unremarkable.  She has an elevated blood  pressure here initially at 177/105 and repeat BP was 160/104.  This could be due to acute viral illness however recommended she follow-up with her primary care doctor to discuss her elevated blood pressure if this continues.  Return and ED precautions given and voiced understanding. Discussed MDM, treatment plan and plan for follow-up with patient who agrees with plan.     Final Clinical Impressions(s) / UC Diagnoses   Final diagnoses:  Influenza A with respiratory manifestations  Elevated blood pressure reading without diagnosis of hypertension     Discharge Instructions      You have influenza A.  Tamiflu  was prescribed.  Your symptoms will gradually improve over the next 7 to 10 days.  The cough may last about 3 weeks.   Take ibuprofen 400 mg with Tylenol 1000 mg for fever, headache or body aches.   For cough:  Stop by the the pharmacy to pick up your prescription cough medication. You can use a humidifier for chest congestion and cough.  If you don't have a humidifier, you can sit in the bathroom with the hot shower running.      For sore throat: try warm salt water gargles, Mucinex  sore throat cough drops or cepacol lozenges, throat spray, warm tea or water with lemon/honey, popsicles or ice, or OTC cold relief medicine for throat discomfort. You can also purchase chloraseptic spray at the pharmacy or dollar store.   For congestion: take a daily anti-histamine like Zyrtec, Claritin, and a oral decongestant, such as pseudoephedrine.  You can also use Flonase  1-2 sprays in each nostril daily. Afrin is also a good option, if you do not have high blood pressure.    It is important to stay hydrated: drink plenty of fluids (water,  gatorade/powerade/pedialyte, juices, or teas) to keep your throat moisturized and help further relieve irritation/discomfort.    Return or go to the Emergency Department if symptoms worsen or do not improve in the next few days     ED Prescriptions     Medication Sig Dispense Auth. Provider   oseltamivir  (TAMIFLU ) 75 MG capsule Take 1 capsule (75 mg total) by mouth every 12 (twelve) hours. 10 capsule Cali Hope, DO   chlorpheniramine-HYDROcodone (TUSSIONEX) 10-8 MG/5ML Take 5 mLs by mouth every 12 (twelve) hours as needed. 115 mL Bria Sparr, DO      I have reviewed the PDMP during this encounter.       Rukaya Kleinschmidt, DO 06/23/23 1021

## 2023-06-23 NOTE — ED Triage Notes (Signed)
 Patient c/o sore throat, cough, runny nose that started on Friday.  Patient unsure of fevers.

## 2023-06-23 NOTE — Discharge Instructions (Addendum)
 You have influenza A.  Tamiflu was prescribed.  Your symptoms will gradually improve over the next 7 to 10 days.  The cough may last about 3 weeks.   Take ibuprofen 400 mg with Tylenol 1000 mg for fever, headache or body aches.   For cough:  Stop by the the pharmacy to pick up your prescription cough medication. You can use a humidifier for chest congestion and cough.  If you don't have a humidifier, you can sit in the bathroom with the hot shower running.      For sore throat: try warm salt water gargles, Mucinex sore throat cough drops or cepacol lozenges, throat spray, warm tea or water with lemon/honey, popsicles or ice, or OTC cold relief medicine for throat discomfort. You can also purchase chloraseptic spray at the pharmacy or dollar store.   For congestion: take a daily anti-histamine like Zyrtec, Claritin, and a oral decongestant, such as pseudoephedrine.  You can also use Flonase 1-2 sprays in each nostril daily. Afrin is also a good option, if you do not have high blood pressure.    It is important to stay hydrated: drink plenty of fluids (water, gatorade/powerade/pedialyte, juices, or teas) to keep your throat moisturized and help further relieve irritation/discomfort.    Return or go to the Emergency Department if symptoms worsen or do not improve in the next few days

## 2023-08-07 ENCOUNTER — Ambulatory Visit
Admission: EM | Admit: 2023-08-07 | Discharge: 2023-08-07 | Disposition: A | Discharge status: Home / Self Care | Attending: Family Medicine | Admitting: Family Medicine

## 2023-08-07 ENCOUNTER — Encounter: Payer: Self-pay | Admitting: Emergency Medicine

## 2023-08-07 ENCOUNTER — Ambulatory Visit (INDEPENDENT_AMBULATORY_CARE_PROVIDER_SITE_OTHER)

## 2023-08-07 DIAGNOSIS — R051 Acute cough: Secondary | ICD-10-CM

## 2023-08-07 DIAGNOSIS — J069 Acute upper respiratory infection, unspecified: Secondary | ICD-10-CM

## 2023-08-07 MED ORDER — ALBUTEROL SULFATE HFA 108 (90 BASE) MCG/ACT IN AERS: 1.0000 | INHALATION_SPRAY | Freq: Four times a day (QID) | RESPIRATORY_TRACT | 0 refills | Status: DC | PRN

## 2023-08-07 MED ORDER — BENZONATATE 100 MG PO CAPS
100.0000 mg | ORAL_CAPSULE | Freq: Three times a day (TID) | ORAL | 0 refills | Status: DC
Start: 2023-08-07 — End: 2024-02-14

## 2023-08-07 NOTE — ED Provider Notes (Signed)
 [facility]    CSN: [id number] Arrival date & time: 08/07/23  1558      History   Chief Complaint Chief Complaint  Patient presents with   Cough   Nasal Congestion   Wheezing    HPI Ann Roman is a 63 y.o. female  presents for evaluation of URI symptoms for 4 days. Patient reports associated symptoms of cough, congestion, shortness of breath and wheezing. Denies N/V/D, fevers, ear pain, sore throat, body aches. Patient does not have a hx of asthma. Patient is not an active smoker.   Reports sick cough text via grandchild..  Pt has taken recent checks OTC for symptoms. Pt has no other concerns at this time.    Cough Associated symptoms: shortness of breath and wheezing   Wheezing Associated symptoms: cough and shortness of breath     Past Medical History:  Diagnosis Date   Anxiety    Colitis, ulcerative (HCC)    Depression (emotion)    GERD (gastroesophageal reflux disease)    HPV (human papilloma virus) infection    Liver cirrhosis (HCC)    Osteoporosis     There are no active problems to display for this patient.   Past Surgical History:  Procedure Laterality Date   CHOLECYSTECTOMY     COLON SURGERY     EYE SURGERY      OB History   No obstetric history on file.      Home Medications    Prior to Admission medications   Medication Sig Start Date End Date Taking? Authorizing Provider  albuterol (VENTOLIN HFA) 108 (90 Base) MCG/ACT inhaler Inhale 1-2 puffs into the lungs every 6 (six) hours as needed. 08/07/23  Yes Radford Pax, NP  benzonatate (TESSALON) 100 MG capsule Take 1 capsule (100 mg total) by mouth every 8 (eight) hours. 08/07/23  Yes Radford Pax, NP  chlorpheniramine-HYDROcodone (TUSSIONEX) 10-8 MG/5ML Take 5 mLs by mouth every 12 (twelve) hours as needed. 06/23/23   Katha Cabal, DO  Cholecalciferol (VITAMIN D3) 25 MCG (1000 UT) CAPS Take by mouth.    [provider]  citalopram (CELEXA) 10 MG tablet Take 15 mg by  mouth every morning. 01/11/22   [provider]  FLUoxetine (PROZAC) 20 MG capsule Take by mouth. 10/31/17   [provider]  fluticasone (FLONASE) 50 MCG/ACT nasal spray Place 2 sprays into both nostrils daily. 03/11/22   Domenick Gong, MD  omeprazole (PRILOSEC) 20 MG capsule Take 20 mg by mouth daily.    [provider]  oseltamivir (TAMIFLU) 75 MG capsule Take 1 capsule (75 mg total) by mouth every 12 (twelve) hours. 06/23/23   Katha Cabal, DO  predniSONE (DELTASONE) 10 MG tablet Take 10 mg by mouth daily with breakfast.    [provider]  Spacer/Aero-Holding Chambers (AEROCHAMBER MV) inhaler Use as instructed 03/11/22   Domenick Gong, MD  traZODone (DESYREL) 50 MG tablet Take 25 mg by mouth at bedtime as needed. 07/23/22   [provider]  ursodiol (ACTIGALL) 300 MG capsule Take by mouth. 08/06/22   [provider]    Family History Family History  Problem Relation Age of Onset   Dementia Mother    Heart disease Father    Glaucoma Father     Social History Social History   Tobacco Use   Smoking status: Never   Smokeless tobacco: Never  Vaping Use   Vaping status: Never Used  Substance Use Topics   Alcohol use: No  Drug use: Never     Allergies   Oxycodone, Promethazine, and Sulfa antibiotics   Review of Systems Review of Systems  HENT:  Positive for congestion.   Respiratory:  Positive for cough, shortness of breath and wheezing.      Physical Exam Triage Vital Signs ED Triage Vitals  Encounter Vitals Group     BP 08/07/23 1635 (!) 138/98     Systolic BP Percentile --      Diastolic BP Percentile --      Pulse Rate 08/07/23 1635 80     Resp 08/07/23 1635 16     Temp 08/07/23 1635 98.1 F (36.7 C)     Temp src --      SpO2 08/07/23 1635 94 %     Weight --      Height --      Head Circumference --      Peak Flow --      Pain Score 08/07/23 1634 0     Pain Loc --      Pain Education --       Exclude from Growth Chart --    No data found.  Updated Vital Signs BP (!) 138/98 (BP Location: Right Arm)   Pulse 80   Temp 98.1 F (36.7 C)   Resp 16   SpO2 94%   Visual Acuity Right Eye Distance:   Left Eye Distance:   Bilateral Distance:    Right Eye Near:   Left Eye Near:    Bilateral Near:     Physical Exam Vitals and nursing note reviewed.  Constitutional:      General: She is not in acute distress.    Appearance: She is well-developed. She is not ill-appearing.  HENT:     Head: Normocephalic and atraumatic.     Right Ear: Tympanic membrane and ear canal normal.     Left Ear: Tympanic membrane and ear canal normal.     Nose: Congestion present.     Mouth/Throat:     Mouth: Mucous membranes are moist.     Pharynx: Oropharynx is clear. Uvula midline. No oropharyngeal exudate or posterior oropharyngeal erythema.     Tonsils: No tonsillar exudate or tonsillar abscesses.  Eyes:     Conjunctiva/sclera: Conjunctivae normal.     Pupils: Pupils are equal, round, and reactive to light.  Cardiovascular:     Rate and Rhythm: Normal rate and regular rhythm.     Heart sounds: Normal heart sounds.  Pulmonary:     Effort: Pulmonary effort is normal.     Breath sounds: Normal breath sounds.  Musculoskeletal:     Cervical back: Normal range of motion and neck supple.  Lymphadenopathy:     Cervical: No cervical adenopathy.  Skin:    General: Skin is warm and dry.  Neurological:     General: No focal deficit present.     Mental Status: She is alert and oriented to person, place, and time.  Psychiatric:        Mood and Affect: Mood normal.        Behavior: Behavior normal.      UC Treatments / Results  Labs (all labs ordered are listed, but only abnormal results are displayed) Labs Reviewed - No data to display  EKG   Radiology No results found.  Procedures Procedures (including critical care time)  Medications Ordered in UC Medications - No data to  display  Initial Impression / Assessment and Plan / UC Course  I have reviewed the triage vital signs and the nursing notes.  Pertinent labs & imaging results that were available during my care of the patient were reviewed by me and considered in my medical decision making (see chart for details).     Reviewed exam and symptoms with patient.  No red flags.  Patient declined COVID or flu testing.  Wet read of chest x-ray without obvious pneumonia, will contact for any positive results based on radiology overread.  Discussed viral illness and symptomatic treatment.  Advised PCP follow-up 2 to 3 days for recheck.  ER precautions reviewed and patient verbalized understanding. Final Clinical Impressions(s) / UC Diagnoses   Final diagnoses:  Acute cough  Viral upper respiratory illness     Discharge Instructions      You may take Tessalon 3 times a day as needed for your cough.  You may use the albuterol inhaler as needed for any shortness of breath.  Lots of rest and fluids.  Please follow-up with your PCP in 2 to 3 days for recheck.  Please go to the ER for any worsening symptoms.  Hope you feel better soon!     ED Prescriptions     Medication Sig Dispense Auth. Provider   benzonatate (TESSALON) 100 MG capsule Take 1 capsule (100 mg total) by mouth every 8 (eight) hours. 21 capsule Radford Pax, NP   albuterol (VENTOLIN HFA) 108 (90 Base) MCG/ACT inhaler Inhale 1-2 puffs into the lungs every 6 (six) hours as needed. 1 each Radford Pax, NP      PDMP not reviewed this encounter.   Radford Pax, NP 08/07/23 1729

## 2023-08-07 NOTE — Discharge Instructions (Addendum)
 You may take Tessalon 3 times a day as needed for your cough.  You may use the albuterol inhaler as needed for any shortness of breath.  Lots of rest and fluids.  Please follow-up with your PCP in 2 to 3 days for recheck.  Please go to the ER for any worsening symptoms.  Hope you feel better soon!

## 2023-08-07 NOTE — ED Triage Notes (Signed)
 Pt c/o cough, chest congestion,nasal congestion and wheezing x 4 days.

## 2024-02-14 ENCOUNTER — Ambulatory Visit (INDEPENDENT_AMBULATORY_CARE_PROVIDER_SITE_OTHER)

## 2024-02-14 ENCOUNTER — Ambulatory Visit: Admission: EM | Admit: 2024-02-14 | Discharge: 2024-02-14 | Disposition: A | Discharge status: Home / Self Care

## 2024-02-14 DIAGNOSIS — J22 Unspecified acute lower respiratory infection: Secondary | ICD-10-CM

## 2024-02-14 DIAGNOSIS — J069 Acute upper respiratory infection, unspecified: Secondary | ICD-10-CM

## 2024-02-14 DIAGNOSIS — R052 Subacute cough: Secondary | ICD-10-CM

## 2024-02-14 MED ORDER — BENZONATATE 100 MG PO CAPS: 100.0000 mg | ORAL_CAPSULE | Freq: Three times a day (TID) | ORAL | 0 refills | Status: AC

## 2024-02-14 MED ORDER — IPRATROPIUM BROMIDE 0.06 % NA SOLN: 2.0000 | Freq: Four times a day (QID) | NASAL | 12 refills | Status: AC

## 2024-02-14 MED ORDER — GUAIFENESIN-CODEINE 100-10 MG/5ML PO SOLN: 5.0000 mL | Freq: Four times a day (QID) | ORAL | 0 refills | Status: AC | PRN

## 2024-02-14 MED ORDER — ALBUTEROL SULFATE HFA 108 (90 BASE) MCG/ACT IN AERS: 1.0000 | INHALATION_SPRAY | Freq: Four times a day (QID) | RESPIRATORY_TRACT | 0 refills | Status: AC | PRN

## 2024-02-14 NOTE — ED Provider Notes (Signed)
 [facility]    CSN: [id number] Arrival date & time: 02/14/24  0808      History   Chief Complaint Chief Complaint  Patient presents with   Cough    HPI Ann Roman is a 63 y.o. female.   HPI  63 year old female with past medical history significant for anxiety, depression, ulcerative colitis, GERD, cirrhosis, and osteoporosis presents for evaluation of 2 weeks worth of cough and chest congestion.  She reports that she does have some nasal congestion that she has been treating with Sudafed.  She recently stopped using Sudafed and started using Mucinex but has not noticed any improvement of her chest symptoms.  She denies runny nose, sore throat, ear pain, or shortness of breath.  She reports that she has had some wheezing.  Past Medical History:  Diagnosis Date   Anxiety    Colitis, ulcerative (HCC)    Depression (emotion)    GERD (gastroesophageal reflux disease)    HPV (human papilloma virus) infection    Liver cirrhosis (HCC)    Osteoporosis     There are no active problems to display for this patient.   Past Surgical History:  Procedure Laterality Date   CHOLECYSTECTOMY     COLON SURGERY     EYE SURGERY      OB History   No obstetric history on file.      Home Medications    Prior to Admission medications   Medication Sig Start Date End Date Taking? Authorizing Provider  Cholecalciferol (VITAMIN D3) 25 MCG (1000 UT) CAPS Take by mouth.   Yes [provider]  citalopram (CELEXA) 10 MG tablet Take 15 mg by mouth every morning. 01/11/22  Yes [provider]  guaiFENesin-codeine 100-10 MG/5ML syrup Take 5 mLs by mouth every 6 (six) hours as needed for cough. 02/14/24  Yes Bernardino Ditch, NP  ipratropium (ATROVENT) 0.06 % nasal spray Place 2 sprays into both nostrils 4 (four) times daily. 02/14/24  Yes Bernardino Ditch, NP  losartan (COZAAR) 25 MG tablet Take 25 mg by mouth. 01/20/24 01/19/25 Yes [provider]  omeprazole  (PRILOSEC) 20 MG capsule Take 20 mg by mouth daily.   Yes [provider]  Spacer/Aero-Holding Chambers (AEROCHAMBER MV) inhaler Use as instructed 03/11/22  Yes Van Knee, MD  ursodiol (ACTIGALL) 300 MG capsule Take by mouth. 08/06/22  Yes [provider]  albuterol  (VENTOLIN  HFA) 108 (90 Base) MCG/ACT inhaler Inhale 1-2 puffs into the lungs every 6 (six) hours as needed. 02/14/24   Bernardino Ditch, NP  benzonatate  (TESSALON ) 100 MG capsule Take 1 capsule (100 mg total) by mouth every 8 (eight) hours. 02/14/24   Bernardino Ditch, NP    Family History Family History  Problem Relation Age of Onset   Dementia Mother    Heart disease Father    Glaucoma Father     Social History Social History   Tobacco Use   Smoking status: Never   Smokeless tobacco: Never  Vaping Use   Vaping status: Never Used  Substance Use Topics   Alcohol use: No   Drug use: Never     Allergies   Oxycodone, Promethazine, and Sulfa antibiotics   Review of Systems Review of Systems  Constitutional:  Negative for fever.  HENT:  Positive for congestion. Negative for ear pain, rhinorrhea and sore throat.   Respiratory:  Positive for cough and wheezing. Negative for shortness of breath.      Physical Exam Triage Vital Signs ED Triage  Vitals  Encounter Vitals Group     BP      Girls Systolic BP Percentile      Girls Diastolic BP Percentile      Boys Systolic BP Percentile      Boys Diastolic BP Percentile      Pulse      Resp      Temp      Temp src      SpO2      Weight      Height      Head Circumference      Peak Flow      Pain Score      Pain Loc      Pain Education      Exclude from Growth Chart    No data found.  Updated Vital Signs BP (!) 139/90 (BP Location: Left Arm)   Pulse 67   Temp 97.9 F (36.6 C) (Oral)   Wt 111 lb (50.3 kg)   SpO2 95%   BMI 20.97 kg/m   Visual Acuity Right Eye Distance:   Left Eye Distance:   Bilateral Distance:    Right Eye  Near:   Left Eye Near:    Bilateral Near:     Physical Exam Vitals and nursing note reviewed.  Constitutional:      Appearance: Normal appearance. She is not ill-appearing.  HENT:     Head: Normocephalic and atraumatic.     Nose: Congestion and rhinorrhea present.     Comments: Patient mucosa is mildly edematous and slightly pale in color.  Scant clear discharge visible in both nares. Cardiovascular:     Rate and Rhythm: Normal rate and regular rhythm.     Pulses: Normal pulses.     Heart sounds: Normal heart sounds. No murmur heard.    No friction rub. No gallop.  Pulmonary:     Effort: Pulmonary effort is normal.     Breath sounds: Normal breath sounds. No wheezing, rhonchi or rales.  Musculoskeletal:     Cervical back: Normal range of motion and neck supple. No tenderness.  Lymphadenopathy:     Cervical: No cervical adenopathy.  Skin:    General: Skin is warm and dry.     Capillary Refill: Capillary refill takes less than 2 seconds.     Findings: No rash.  Neurological:     General: No focal deficit present.     Mental Status: She is alert and oriented to person, place, and time.      UC Treatments / Results  Labs (all labs ordered are listed, but only abnormal results are displayed) Labs Reviewed - No data to display  EKG   Radiology DG Chest 2 View Result Date: 02/14/2024 CLINICAL DATA:  Cough and wheezing. EXAM: CHEST - 2 VIEW COMPARISON:  08/07/2023 FINDINGS: The lungs are clear without focal pneumonia, edema, pneumothorax or pleural effusion. The cardiopericardial silhouette is within normal limits for size. No acute bony abnormality. IMPRESSION: No active cardiopulmonary disease. Electronically Signed   By: Camellia Candle M.D.   On: 02/14/2024 08:50    Procedures Procedures (including critical care time)  Medications Ordered in UC Medications - No data to display  Initial Impression / Assessment and Plan / UC Course  I have reviewed the triage vital  signs and the nursing notes.  Pertinent labs & imaging results that were available during my care of the patient were reviewed by me and considered in my medical decision making (see chart for  details).   Patient is a nontoxic-appearing 63 year old female presenting for evaluation of 2 weeks worth of respiratory symptoms.  Her symptoms are largely confined to her lower respiratory tract though she does endorse some nasal congestion which has responded to Sudafed and Mucinex.  She reports that she feels like she has mucus in her chest and she reports that she has had some wheezing.  In the exam room she is able to speak in full sentences without dyspnea or tachypnea.  Oxygen saturation is 95% on room air.  She is afebrile with oral temp of 97.9.  Her lungs are clear to auscultation in all fields.  However, given that she is complaining of wheezing and feeling like she has mucus present in her chest I will obtain a chest x-ray to evaluate for any acute cardiopulmonary pathology.  Chest x-ray independently reviewed and evaluated by me.  Impression: No evidence of infiltrate or effusion.  Cardiomediastinal silhouette appears normal.  Radiology overread is pending. Radiology impression states no active cardiopulmonary disease.  I will discharge patient with a diagnosis of lower respiratory tract infection.  Due to the fact that she has been experiencing symptoms for 2 weeks I do feel a trial of antibiotics is warranted.  I will start her on azithromycin  once daily for 5 days to cover for any potential bacterial sources.  I will also prescribe Atrovent nasal spray to help her with her nasal congestion she can do 2 squirts up each nostril 4 times a day as needed.  Tessalon  Perles and Cheratussin cough syrup for cough and congestion as patient reports increased anxiety with promethazine.  Additionally, I will order an albuterol  inhaler and spacer and she can do 1 to 2 puffs every 4-6 hours as needed for any  shortness of breath or wheezing.   Final Clinical Impressions(s) / UC Diagnoses   Final diagnoses:  Subacute cough  Lower respiratory tract infection     Discharge Instructions      Your chest x-ray did not show any evidence of pneumonia or other infectious process.  However, given that you had symptoms for 2 weeks a trial of antibiotics is warranted.  Take the azithromycin  once daily for 5 days to cover for potential bacterial sources.  Use the albuterol  inhaler, with the spacer, and take 1 to 2 puffs every 4-6 hours as needed for any shortness of breath or wheezing.  Use the Atrovent nasal spray, 2 squirts in each nostril every 6 hours, as needed for runny nose and postnasal drip.  Use the Tessalon  Perles every 8 hours during the day.  Take them with a small sip of water.  They may give you some numbness to the base of your tongue or a metallic taste in your mouth, this is normal.  Use the Cheratussin cough syrup at bedtime for cough and congestion.  It will make you drowsy so do not take it during the day.  Return for reevaluation or see your primary care provider for any new or worsening symptoms.      ED Prescriptions     Medication Sig Dispense Auth. Provider   albuterol  (VENTOLIN  HFA) 108 (90 Base) MCG/ACT inhaler Inhale 1-2 puffs into the lungs every 6 (six) hours as needed. 18 g Bernardino Ditch, NP   benzonatate  (TESSALON ) 100 MG capsule Take 1 capsule (100 mg total) by mouth every 8 (eight) hours. 21 capsule Bernardino Ditch, NP   ipratropium (ATROVENT) 0.06 % nasal spray Place 2 sprays into both nostrils 4 (  four) times daily. 15 mL Bernardino Ditch, NP   guaiFENesin-codeine 100-10 MG/5ML syrup Take 5 mLs by mouth every 6 (six) hours as needed for cough. 118 mL Bernardino Ditch, NP      I have reviewed the PDMP during this encounter.   Bernardino Ditch, NP 02/14/24 [id number]

## 2024-02-14 NOTE — ED Triage Notes (Signed)
 Pt c/o cough and congestion x14days  Pt states that she has been taking sudafed for the symptoms and it is not helping  Pt states that the congestion is in her chest.

## 2024-02-14 NOTE — ED Triage Notes (Signed)
 Pt is legally blind.

## 2024-02-14 NOTE — Discharge Instructions (Addendum)
 Your chest x-ray did not show any evidence of pneumonia or other infectious process.  However, given that you had symptoms for 2 weeks a trial of antibiotics is warranted.  Take the azithromycin  once daily for 5 days to cover for potential bacterial sources.  Use the albuterol  inhaler, with the spacer, and take 1 to 2 puffs every 4-6 hours as needed for any shortness of breath or wheezing.  Use the Atrovent nasal spray, 2 squirts in each nostril every 6 hours, as needed for runny nose and postnasal drip.  Use the Tessalon  Perles every 8 hours during the day.  Take them with a small sip of water.  They may give you some numbness to the base of your tongue or a metallic taste in your mouth, this is normal.  Use the Cheratussin cough syrup at bedtime for cough and congestion.  It will make you drowsy so do not take it during the day.  Return for reevaluation or see your primary care provider for any new or worsening symptoms.

## 2024-05-26 ENCOUNTER — Ambulatory Visit: Admission: EM | Admit: 2024-05-26 | Discharge: 2024-05-26 | Disposition: A | Discharge status: Home / Self Care

## 2024-05-26 DIAGNOSIS — S0181XA Laceration without foreign body of other part of head, initial encounter: Secondary | ICD-10-CM

## 2024-05-26 NOTE — ED Triage Notes (Signed)
 Pt c/o head injury that happened this AM. States was bent over & hit her head against her dresser. Denies any LOC or dizziness. Has tried OTC meds w/o relief.

## 2024-05-26 NOTE — ED Provider Notes (Signed)
 " [facility]    CSN: [id number] Arrival date & time: 05/26/24  1044      History   Chief Complaint Chief Complaint  Patient presents with   Head Injury    HPI Ann Roman is a 64 y.o. female presenting for small laceration of the forehead that occurred this morning.  Patient reports she was bending over to put close in her dresser and actually hit her forehead on the dresser.  She denies falling down or losing consciousness.  She denies any headaches, numbness/tingling or weakness.  She has history of visual impairment but no recent worsening.  No nausea, vomiting, confusion.  Patient has taken Tylenol and used a Band-Aid and reports feeling a bit better.  Her family encouraged her to seek evaluation just to make sure everything was okay.  Her medical history is significant for anxiety, depression, ulcerative colitis, GERD and cirrhosis.  She is not on any anticoagulants.  HPI  Past Medical History:  Diagnosis Date   Anxiety    Colitis, ulcerative (HCC)    Depression (emotion)    GERD (gastroesophageal reflux disease)    HPV (human papilloma virus) infection    Liver cirrhosis (HCC)    Osteoporosis     There are no active problems to display for this patient.   Past Surgical History:  Procedure Laterality Date   CHOLECYSTECTOMY     COLON SURGERY     EYE SURGERY      OB History   No obstetric history on file.      Home Medications    Prior to Admission medications  Medication Sig Start Date End Date Taking? Authorizing Provider  prednisoLONE acetate (PRED FORTE) 1 % ophthalmic suspension SHAKE LIQUID AND INSTILL 1 DROP IN RIGHT EYE FOUR TIMES DAILY FOR 4 DAYS 05/18/24  Yes [provider]  albuterol  (VENTOLIN  HFA) 108 (90 Base) MCG/ACT inhaler Inhale 1-2 puffs into the lungs every 6 (six) hours as needed. 02/14/24   Bernardino Ditch, NP  benzonatate  (TESSALON ) 100 MG capsule Take 1 capsule (100 mg total) by mouth every 8 (eight) hours. 02/14/24    Bernardino Ditch, NP  Cholecalciferol (VITAMIN D3) 25 MCG (1000 UT) CAPS Take by mouth.    [provider]  citalopram (CELEXA) 10 MG tablet Take 15 mg by mouth every morning. 01/11/22   [provider]  guaiFENesin -codeine  100-10 MG/5ML syrup Take 5 mLs by mouth every 6 (six) hours as needed for cough. 02/14/24   Bernardino Ditch, NP  ipratropium (ATROVENT ) 0.06 % nasal spray Place 2 sprays into both nostrils 4 (four) times daily. 02/14/24   Bernardino Ditch, NP  losartan (COZAAR) 25 MG tablet Take 25 mg by mouth. 01/20/24 01/19/25  [provider]  omeprazole (PRILOSEC) 20 MG capsule Take 20 mg by mouth daily.    [provider]  Spacer/Aero-Holding Chambers (AEROCHAMBER MV) inhaler Use as instructed 03/11/22   Mortenson, Ashley, MD  ursodiol (ACTIGALL) 300 MG capsule Take by mouth. 08/06/22   [provider]    Family History Family History  Problem Relation Age of Onset   Dementia Mother    Heart disease Father    Glaucoma Father     Social History Social History[1]   Allergies   Oxycodone, Promethazine, and Sulfa antibiotics   Review of Systems Review of Systems  Constitutional:  Negative for fatigue.  Eyes:  Negative for photophobia.  Gastrointestinal:  Negative for nausea and vomiting.  Musculoskeletal:  Negative for gait problem and  neck pain.  Skin:  Positive for wound. Negative for color change.  Neurological:  Negative for dizziness, syncope, facial asymmetry, speech difficulty, weakness, light-headedness, numbness and headaches.  Psychiatric/Behavioral:  Negative for confusion.      Physical Exam Triage Vital Signs ED Triage Vitals  Encounter Vitals Group     BP 05/26/24 1057 (!) 140/98     Girls Systolic BP Percentile --      Girls Diastolic BP Percentile --      Boys Systolic BP Percentile --      Boys Diastolic BP Percentile --      Pulse Rate 05/26/24 1057 78     Resp 05/26/24 1057 18     Temp 05/26/24 1057 97.6 F (36.4 C)      Temp Source 05/26/24 1057 Oral     SpO2 05/26/24 1057 100 %     Weight --      Height --      Head Circumference --      Peak Flow --      Pain Score 05/26/24 1056 5     Pain Loc --      Pain Education --      Exclude from Growth Chart --    No data found.  Updated Vital Signs BP (!) 140/98 (BP Location: Left Arm)   Pulse 78   Temp 97.6 F (36.4 C) (Oral)   Resp 18   SpO2 100%    Physical Exam Vitals and nursing note reviewed.  Constitutional:      General: She is not in acute distress.    Appearance: Normal appearance. She is not ill-appearing or toxic-appearing.  HENT:     Head:     Comments: 1 cm superficial laceration to the right frontal region.  No swelling.    Right Ear: Tympanic membrane, ear canal and external ear normal.     Left Ear: Tympanic membrane, ear canal and external ear normal.     Nose: Nose normal.     Mouth/Throat:     Mouth: Mucous membranes are moist.     Pharynx: Oropharynx is clear.  Eyes:     General: No scleral icterus.       Right eye: No discharge.        Left eye: No discharge.     Extraocular Movements: Extraocular movements intact.     Conjunctiva/sclera: Conjunctivae normal.     Pupils: Pupils are equal, round, and reactive to light.  Cardiovascular:     Rate and Rhythm: Normal rate and regular rhythm.     Heart sounds: Normal heart sounds.  Pulmonary:     Effort: Pulmonary effort is normal. No respiratory distress.     Breath sounds: Normal breath sounds.  Musculoskeletal:     Cervical back: Neck supple.  Skin:    General: Skin is dry.  Neurological:     General: No focal deficit present.     Mental Status: She is alert and oriented to person, place, and time. Mental status is at baseline.     Cranial Nerves: No cranial nerve deficit.     Motor: No weakness.     Coordination: Coordination normal.     Gait: Gait normal.  Psychiatric:        Mood and Affect: Mood normal.        Behavior: Behavior normal.      UC  Treatments / Results  Labs (all labs ordered are listed, but only abnormal results are displayed) Labs  Reviewed - No data to display  EKG   Radiology No results found.  Procedures Procedures (including critical care time)  Medications Ordered in UC Medications - No data to display  Initial Impression / Assessment and Plan / UC Course  I have reviewed the triage vital signs and the nursing notes.  Pertinent labs & imaging results that were available during my care of the patient were reviewed by me and considered in my medical decision making (see chart for details).   64 year old female presents for small laceration to the right forehead that occurred today when she accidentally hit her head on the dresser in her room.  Denies fall or loss of consciousness.  Has used a Band-Aid and taken Tylenol and is feeling better.  Denies any red flag signs or symptoms.  See image included in chart.  Patient has approximately 1 cm superficial laceration with early scab formation.  There is no appreciable swelling or contusion and normal neurological exam.  Minor head injury and superficial laceration.  Discussed laceration/wound care with patient.  Advised of ED evaluation if severe headaches, dizziness, worsening vision, numbness/tingling, vomiting, excetra.  Patient is agreeable.  Patient's husband was also present and I encouraged them to look after her.   Final Clinical Impressions(s) / UC Diagnoses   Final diagnoses:  Laceration of forehead, initial encounter     Discharge Instructions      - Clean the wound with soap and water and apply Neosporin.  If you notice any surrounding redness or pustular drainage please return for reevaluation. - You have a minor head injury but if you experience any severe headaches, dizziness, worsening of vision, numbness/tingling, vomiting please call 911 or have someone take you immediately to the ER.     ED Prescriptions   None    PDMP not  reviewed this encounter.     [1]  Social History Tobacco Use   Smoking status: Never   Smokeless tobacco: Never  Vaping Use   Vaping status: Never Used  Substance Use Topics   Alcohol use: No   Drug use: Never     Arvis Jolan KATHEE DEVONNA 05/26/24 1251  "

## 2024-05-26 NOTE — Discharge Instructions (Signed)
-   Clean the wound with soap and water and apply Neosporin.  If you notice any surrounding redness or pustular drainage please return for reevaluation. - You have a minor head injury but if you experience any severe headaches, dizziness, worsening of vision, numbness/tingling, vomiting please call 911 or have someone take you immediately to the ER.

## 2024-06-12 ENCOUNTER — Ambulatory Visit
